# Patient Record
Sex: Male | Born: 1995 | Race: White | Hispanic: No | Marital: Single | Smoking: Former smoker
Health system: Southern US, Community
[De-identification: ages and names within clinical notes are randomized; demographics above are authoritative.]

## PROBLEM LIST (undated history)

## (undated) DIAGNOSIS — L0501 Pilonidal cyst with abscess: Secondary | ICD-10-CM

## (undated) DIAGNOSIS — T4145XA Adverse effect of unspecified anesthetic, initial encounter: Secondary | ICD-10-CM

## (undated) DIAGNOSIS — E538 Deficiency of other specified B group vitamins: Secondary | ICD-10-CM

## (undated) DIAGNOSIS — R51 Headache: Secondary | ICD-10-CM

## (undated) DIAGNOSIS — R519 Headache, unspecified: Secondary | ICD-10-CM

## (undated) DIAGNOSIS — T8859XA Other complications of anesthesia, initial encounter: Secondary | ICD-10-CM

## (undated) HISTORY — PX: TONSILLECTOMY: SUR1361

## (undated) HISTORY — DX: Deficiency of other specified B group vitamins: E53.8

## (undated) HISTORY — DX: Pilonidal cyst with abscess: L05.01

## (undated) HISTORY — PX: MIDDLE EAR SURGERY: SHX713

---
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
## 2017-04-11 DIAGNOSIS — L0501 Pilonidal cyst with abscess: Secondary | ICD-10-CM | POA: Insufficient documentation

## 2017-04-14 ENCOUNTER — Ambulatory Visit (INDEPENDENT_AMBULATORY_CARE_PROVIDER_SITE_OTHER): Payer: BC Managed Care – PPO | Admitting: General Surgery

## 2017-04-14 ENCOUNTER — Encounter: Payer: Self-pay | Admitting: General Surgery

## 2017-04-14 VITALS — BP 138/82 | HR 84 | Temp 98.1°F | Wt 202.0 lb

## 2017-04-14 DIAGNOSIS — L0501 Pilonidal cyst with abscess: Secondary | ICD-10-CM

## 2017-04-14 MED ORDER — CLINDAMYCIN HCL 300 MG PO CAPS: 300.0000 mg | ORAL_CAPSULE | Freq: Three times a day (TID) | ORAL | 0 refills | Status: DC

## 2017-04-14 NOTE — Progress Notes (Signed)
[name], male   DOB: [birth year], 21 y.o.   MRN: [id number]  CC: Recurrent pilonidal cyst  HPI Benjamin Green is a 21 y.o. male who presents to clinic in consultation by Benjamin Green for a recurrent pilonidal cyst with abscess.  Patient reports that the area has drained on and off again for the last 4 years.  He has not been seen by a surgeon in greater than 3 years.  Patient reports she has not required antibiotics for some time but the area does continue to drain.  He denies any recent fevers, chills, nausea, vomiting, chest pain, shortness of breath, diarrhea, constipation.  He is here now because he desires to have the area removed.  HPI  Past Medical History:  Diagnosis Date  . Pilonidal cyst with abscess     Past Surgical History:  Procedure Laterality Date  . TONSILLECTOMY      Family History  Problem Relation Age of Onset  . Healthy Mother   . Breast cancer Maternal Aunt     Social History Social History   Tobacco Use  . Smoking status: Former Smoker    Packs/day: 0.50    Years: 6.00    Pack years: 3.00    Last attempt to quit: 06/14/2016    Years since quitting: 0.8  . Smokeless tobacco: Never Used  Substance Use Topics  . Alcohol use: Yes    Comment: occasional  . Drug use: Yes    Types: Marijuana    No Known Allergies  Current Outpatient Medications  Medication Sig Dispense Refill  . CORDRAN 0.05 % CREA Apply 1 application topically 1 day or 1 dose.    Marland Kitchen. ONEXTON 1.2-3.75 % GEL Apply 1 application topically 1 day or 1 dose.    Marland Kitchen. RETIN-A MICRO PUMP 0.08 % GEL Apply 1 application topically 1 day or 1 dose.    . clindamycin (CLEOCIN) 300 MG capsule Take 1 capsule (300 mg total) by mouth 3 (three) times daily. 30 capsule 0   No current facility-administered medications for this visit.      Review of Systems A thigh point review of systems was asked and was negative except for the findings documented in the HPI  Physical Exam Blood  pressure 138/82, pulse 84, temperature 98.1 F (36.7 C), temperature source Oral, weight 91.6 kg (202 lb). CONSTITUTIONAL: No acute distress. EYES: Pupils are equal, round, and reactive to light, Sclera are non-icteric. EARS, NOSE, MOUTH AND THROAT: The oropharynx is clear. The oral mucosa is pink and moist. Hearing is intact to voice. LYMPH NODES:  Lymph nodes in the neck are normal. RESPIRATORY:  Lungs are clear. There is normal respiratory effort, with equal breath sounds bilaterally, and without pathologic use of accessory muscles. CARDIOVASCULAR: Heart is regular without murmurs, gallops, or rubs. GI: The abdomen is soft, nontender, and nondistended. There are no palpable masses. There is no hepatosplenomegaly. There are normal bowel sounds in all quadrants. GU: Rectal deferred.   MUSCULOSKELETAL: Normal muscle strength and tone. No cyanosis or edema.   SKIN: Obvious pilonidal cyst with skin changes of possible chronic infection at the cephalad aspect of the natal cleft.  Multiple puncta of pilonidal disease down the entirety of the cleft without obvious abscess changes to any other pit. NEUROLOGIC: Motor and sensation is grossly normal. Cranial nerves are grossly intact. PSYCH:  Oriented to person, place and time. Affect is normal.  Data Reviewed No images or labs to review I have personally reviewed  the patient's imaging, laboratory findings and medical records.    Assessment    Recurrent pilonidal cyst with abscess    Plan    21 year old male with a recurrent pilonidal cyst with abscess.  Discussed the diagnosis in detail with the patient as well as the surgical options.  Patient initially stated that he wanted the entirety of the pilonidal cysts and pets removed.  However given the large distance between the superior and inferior aspect of the pets I discussed the possibility of doing just an excision of the recurrent abscess.  Discussed that with the patient that the disease the  primary culprit is hair and that adequate hair removal and personal hygiene could prevent further abscesses.  He voiced understanding and agrees to proceed with the smaller excision at this point.  His work schedule only allows him operative time next Friday which I discussed with by my partner, BenjaminPiscoya who agrees to take on this case.  Plan to provide him with a prescription for clindamycin for now given the recurrent infectious nature of the wound.  Plan for excision of the pilonidal cyst with abscess next Friday, 21 December.     Time spent with the patient was 30 minutes, with more than 50% of the time spent in face-to-face education, counseling and care coordination.     Benjamin Frameharles Nykeria Mealing, MD FACS General Surgeon 04/14/2017, 10:29 AM

## 2017-04-14 NOTE — H&P (View-Only) (Signed)
[name], male   DOB: 06/12/1995, 21 y.o.   MRN: [id number]  CC: Recurrent pilonidal cyst  HPI Benjamin Green is a 21 y.o. male who presents to clinic in consultation by Dr. Olmedo for a recurrent pilonidal cyst with abscess.  Patient reports that the area has drained on and off again for the last 4 years.  He has not been seen by a surgeon in greater than 3 years.  Patient reports she has not required antibiotics for some time but the area does continue to drain.  He denies any recent fevers, chills, nausea, vomiting, chest pain, shortness of breath, diarrhea, constipation.  He is here now because he desires to have the area removed.  HPI  Past Medical History:  Diagnosis Date  . Pilonidal cyst with abscess     Past Surgical History:  Procedure Laterality Date  . TONSILLECTOMY      Family History  Problem Relation Age of Onset  . Healthy Mother   . Breast cancer Maternal Aunt     Social History Social History   Tobacco Use  . Smoking status: Former Smoker    Packs/day: 0.50    Years: 6.00    Pack years: 3.00    Last attempt to quit: 06/14/2016    Years since quitting: 0.8  . Smokeless tobacco: Never Used  Substance Use Topics  . Alcohol use: Yes    Comment: occasional  . Drug use: Yes    Types: Marijuana    No Known Allergies  Current Outpatient Medications  Medication Sig Dispense Refill  . CORDRAN 0.05 % CREA Apply 1 application topically 1 day or 1 dose.    . ONEXTON 1.2-3.75 % GEL Apply 1 application topically 1 day or 1 dose.    . RETIN-A MICRO PUMP 0.08 % GEL Apply 1 application topically 1 day or 1 dose.    . clindamycin (CLEOCIN) 300 MG capsule Take 1 capsule (300 mg total) by mouth 3 (three) times daily. 30 capsule 0   No current facility-administered medications for this visit.      Review of Systems A thigh point review of systems was asked and was negative except for the findings documented in the HPI  Physical Exam Blood  pressure 138/82, pulse 84, temperature 98.1 F (36.7 C), temperature source Oral, weight 91.6 kg (202 lb). CONSTITUTIONAL: No acute distress. EYES: Pupils are equal, round, and reactive to light, Sclera are non-icteric. EARS, NOSE, MOUTH AND THROAT: The oropharynx is clear. The oral mucosa is pink and moist. Hearing is intact to voice. LYMPH NODES:  Lymph nodes in the neck are normal. RESPIRATORY:  Lungs are clear. There is normal respiratory effort, with equal breath sounds bilaterally, and without pathologic use of accessory muscles. CARDIOVASCULAR: Heart is regular without murmurs, gallops, or rubs. GI: The abdomen is soft, nontender, and nondistended. There are no palpable masses. There is no hepatosplenomegaly. There are normal bowel sounds in all quadrants. GU: Rectal deferred.   MUSCULOSKELETAL: Normal muscle strength and tone. No cyanosis or edema.   SKIN: Obvious pilonidal cyst with skin changes of possible chronic infection at the cephalad aspect of the natal cleft.  Multiple puncta of pilonidal disease down the entirety of the cleft without obvious abscess changes to any other pit. NEUROLOGIC: Motor and sensation is grossly normal. Cranial nerves are grossly intact. PSYCH:  Oriented to person, place and time. Affect is normal.  Data Reviewed No images or labs to review I have personally reviewed   the patient's imaging, laboratory findings and medical records.    Assessment    Recurrent pilonidal cyst with abscess    Plan    21 year old male with a recurrent pilonidal cyst with abscess.  Discussed the diagnosis in detail with the patient as well as the surgical options.  Patient initially stated that he wanted the entirety of the pilonidal cysts and pets removed.  However given the large distance between the superior and inferior aspect of the pets I discussed the possibility of doing just an excision of the recurrent abscess.  Discussed that with the patient that the disease the  primary culprit is hair and that adequate hair removal and personal hygiene could prevent further abscesses.  He voiced understanding and agrees to proceed with the smaller excision at this point.  His work schedule only allows him operative time next Friday which I discussed with by my partner, Dr.Piscoya who agrees to take on this case.  Plan to provide him with a prescription for clindamycin for now given the recurrent infectious nature of the wound.  Plan for excision of the pilonidal cyst with abscess next Friday, 21 December.     Time spent with the patient was 30 minutes, with more than 50% of the time spent in face-to-face education, counseling and care coordination.     Benjamin Frameharles Riot Barrick, MD FACS General Surgeon 04/14/2017, 10:29 AM

## 2017-04-14 NOTE — Patient Instructions (Signed)
Pilonidal Cyst A pilonidal cyst is a fluid-filled sac. It forms beneath the skin near your tailbone, at the top of the crease of your buttocks. A pilonidal cyst that is not large or infected may not cause symptoms or problems. If the cyst becomes irritated or infected, it may fill with pus. This causes pain and swelling (pilonidal abscess). An infected cyst may need to be treated with medicine, drained, or removed. What are the causes? The cause of a pilonidal cyst is not known. One cause may be a hair that grows into your skin (ingrown hair). What increases the risk? Pilonidal cysts are more common in boys and men. Risk factors include:  Having lots of hair near the crease of the buttocks.  Being overweight.  Having a pilonidal dimple.  Wearing tight clothing.  Not bathing or showering frequently.  Sitting for long periods of time.  What are the signs or symptoms? Signs and symptoms of a pilonidal cyst may include:  Redness.  Pain and tenderness.  Warmth.  Swelling.  Pus.  Fever.  How is this diagnosed? Your health care provider may diagnose a pilonidal cyst based on your symptoms and a physical exam. The health care provider may do a blood test to check for infection. If your cyst is draining pus, your health care provider may take a sample of the drainage to be tested at a laboratory. How is this treated? Surgery is the usual treatment for an infected pilonidal cyst. You may also have to take medicines before surgery. The type of surgery you have depends on the size and severity of the infected cyst. The different kinds of surgery include:  Incision and drainage. This is a procedure to open and drain the cyst.  Marsupialization. In this procedure, a large cyst or abscess may be opened and kept open by stitching the edges of the skin to the cyst walls.  Cyst removal. This procedure involves opening the skin and removing all or part of the cyst.  Follow these  instructions at home:  Follow all of your surgeon's instructions carefully if you had surgery.  Take medicines only as directed by your health care provider.  If you were prescribed an antibiotic medicine, finish it all even if you start to feel better.  Keep the area around your pilonidal cyst clean and dry.  Clean the area as directed by your health care provider. Pat the area dry with a clean towel. Do not rub it as this may cause bleeding.  Remove hair from the area around the cyst as directed by your health care provider.  Do not wear tight clothing or sit in one place for long periods of time.  There are many different ways to close and cover an incision, including stitches, skin glue, and adhesive strips. Follow your health care provider's instructions on: ? Incision care. ? Bandage (dressing) changes and removal. ? Incision closure removal. Contact a health care provider if:  You have drainage, redness, swelling, or pain at the site of the cyst.  You have a fever. This information is not intended to replace advice given to you by your health care provider. Make sure you discuss any questions you have with your health care provider. Document Released: 04/15/2000 Document Revised: 09/24/2015 Document Reviewed: 09/05/2013 Elsevier Interactive Patient Education  2018 [employer]  

## 2017-04-18 ENCOUNTER — Telehealth: Payer: Self-pay | Admitting: Surgery

## 2017-04-18 NOTE — Telephone Encounter (Signed)
Pt advised of pre op date/time and sx date. Sx: 04/21/17 with Dr Velvet BathePiscoya--excision of pilonidal cyst.  Pre op: 04/19/17 between 9-1:00pm--Phone interview.   Patient made aware to call [phone], between 1-3:00pm the day before surgery, to find out what time to arrive.    Patient has agreed to pay a deposit for physician charge of 300.00 prior to surgery.

## 2017-04-19 ENCOUNTER — Encounter
Admission: RE | Admit: 2017-04-19 | Discharge: 2017-04-19 | Disposition: A | Discharge status: Home / Self Care | Payer: BC Managed Care – PPO | Source: Ambulatory Visit | Attending: Surgery | Admitting: Surgery

## 2017-04-19 ENCOUNTER — Other Ambulatory Visit: Payer: Self-pay

## 2017-04-19 HISTORY — DX: Headache, unspecified: R51.9

## 2017-04-19 HISTORY — DX: Headache: R51

## 2017-04-19 HISTORY — DX: Other complications of anesthesia, initial encounter: T88.59XA

## 2017-04-19 HISTORY — DX: Adverse effect of unspecified anesthetic, initial encounter: T41.45XA

## 2017-04-19 NOTE — Patient Instructions (Signed)
  Your procedure is scheduled on: 04-21-17 Report to Same Day Surgery 2nd floor medical mall [facility](Medical Mall Entrance-take elevator on left to 2nd floor.  Check in with surgery information desk.) To find out your arrival time please call [phone] between 1PM - 3PM on 21-20-18  Remember: Instructions that are not followed completely may result in serious medical risk, up to and including death, or upon the discretion of your surgeon and anesthesiologist your surgery may need to be rescheduled.    _x___ 1. Do not eat food after midnight the night before your procedure. You may drink clear liquids up to 2 hours before you are scheduled to arrive at the hospital for your procedure.  Do not drink clear liquids within 2 hours of your scheduled arrival to the hospital.  Clear liquids include  --Water or Apple juice without pulp  --Clear carbohydrate beverage such as ClearFast or Gatorade  --Black Coffee or Clear Tea (No milk, no creamers, do not add anything to the coffee or Tea   No gum chewing or hard candies.     __x__ 2. No Alcohol for 24 hours before or after surgery.   __x__3. No Smoking/VAPING for 24 prior to surgery.   ____  4. Bring all medications with you on the day of surgery if instructed.    __x__ 5. Notify your doctor if there is any change in your medical condition     (cold, fever, infections).     Do not wear jewelry, make-up, hairpins, clips or nail polish.  Do not wear lotions, powders, or perfumes. You may wear deodorant.  Do not shave 48 hours prior to surgery. Men may shave face and neck.  Do not bring valuables to the hospital.    [facility] any belongings or valuables.               Contacts, dentures or bridgework may not be worn into surgery.  Leave your suitcase in the car. After surgery it may be brought to your room.  For patients admitted to the hospital, discharge time is determined by your treatment team.   Patients discharged  the day of surgery will not be allowed to drive home.  You will need someone to drive you home and stay with you the night of your procedure.    Please read over the following fact sheets that you were given:   [facility] Preparing [facility] and or MRSA Information   ____ Take anti-hypertensive listed below, cardiac, seizure, asthma,anti-reflux and psychiatric medicines. These include:  1. NONE  2.  3.  4.  5.  6.  ____Fleets enema or Magnesium Citrate as directed.   ____ Use CHG Soap or sage wipes as directed on instruction sheet   ____ Use inhalers on the day of surgery and bring to hospital day of surgery  ____ Stop Metformin and Janumet 2 days prior to surgery.    ____ Take 1/2 of usual insulin dose the night before surgery and none on the morning     surgery.   ____ Follow recommendations from Cardiologist, Pulmonologist or PCP regarding stopping Aspirin, Coumadin, Plavix ,Eliquis, Effient, or Pradaxa, and Pletal.  X____Stop Anti-inflammatories such as Advil, Aleve, IBUPROFEN, Motrin, Naproxen, Naprosyn, Goodies powders or aspirin products NOW-OK to take Tylenol    ____ Stop supplements until after surgery.     ____ Bring C-Pap to the hospital.

## 2017-04-20 ENCOUNTER — Encounter: Payer: Self-pay | Admitting: *Deleted

## 2017-04-20 MED ORDER — CEFAZOLIN SODIUM-DEXTROSE 2-4 GM/100ML-% IV SOLN
2.0000 g | INTRAVENOUS | Status: AC
  Administered 2017-04-21: 2 g via INTRAVENOUS

## 2017-04-21 ENCOUNTER — Encounter
Admission: RE | Disposition: A | Discharge status: Home / Self Care | Payer: Self-pay | Source: Ambulatory Visit | Attending: Surgery

## 2017-04-21 ENCOUNTER — Ambulatory Visit: Payer: BC Managed Care – PPO | Admitting: Certified Registered Nurse Anesthetist

## 2017-04-21 ENCOUNTER — Encounter: Payer: Self-pay | Admitting: *Deleted

## 2017-04-21 ENCOUNTER — Ambulatory Visit
Admission: RE | Admit: 2017-04-21 | Discharge: 2017-04-21 | Disposition: A | Discharge status: Home / Self Care | Payer: BC Managed Care – PPO | Source: Ambulatory Visit | Attending: Surgery | Admitting: Surgery

## 2017-04-21 ENCOUNTER — Other Ambulatory Visit: Payer: Self-pay

## 2017-04-21 DIAGNOSIS — Z87891 Personal history of nicotine dependence: Secondary | ICD-10-CM | POA: Diagnosis not present

## 2017-04-21 DIAGNOSIS — L0501 Pilonidal cyst with abscess: Secondary | ICD-10-CM | POA: Insufficient documentation

## 2017-04-21 HISTORY — PX: PILONIDAL CYST EXCISION: SHX744

## 2017-04-21 LAB — URINE DRUG SCREEN, QUALITATIVE (ARMC ONLY)
AMPHETAMINES, UR SCREEN: NOT DETECTED
Barbiturates, Ur Screen: NOT DETECTED
Benzodiazepine, Ur Scrn: NOT DETECTED
CANNABINOID 50 NG, UR ~~LOC~~: POSITIVE — AB
COCAINE METABOLITE, UR ~~LOC~~: NOT DETECTED
MDMA (ECSTASY) UR SCREEN: NOT DETECTED
METHADONE SCREEN, URINE: NOT DETECTED
Opiate, Ur Screen: NOT DETECTED
Phencyclidine (PCP) Ur S: NOT DETECTED
TRICYCLIC, UR SCREEN: NOT DETECTED

## 2017-04-21 SURGERY — EXCISION, PILONIDAL CYST, EXTENSIVE
Anesthesia: General | Site: Buttocks | Wound class: Dirty or Infected

## 2017-04-21 MED ORDER — MEPERIDINE HCL 50 MG/ML IJ SOLN: 6.2500 mg | INTRAMUSCULAR | Status: DC | PRN

## 2017-04-21 MED ORDER — PROMETHAZINE HCL 25 MG/ML IJ SOLN: 6.2500 mg | INTRAMUSCULAR | Status: DC | PRN

## 2017-04-21 MED ORDER — ONDANSETRON HCL 4 MG/2ML IJ SOLN
INTRAMUSCULAR | Status: AC
  Filled 2017-04-21: qty 2

## 2017-04-21 MED ORDER — ONDANSETRON HCL 4 MG/2ML IJ SOLN
INTRAMUSCULAR | Status: DC | PRN
  Administered 2017-04-21: 4 mg via INTRAVENOUS

## 2017-04-21 MED ORDER — OXYCODONE HCL 5 MG/5ML PO SOLN: 5.0000 mg | Freq: Once | ORAL | Status: AC | PRN

## 2017-04-21 MED ORDER — SUGAMMADEX SODIUM 200 MG/2ML IV SOLN
INTRAVENOUS | Status: DC | PRN
  Administered 2017-04-21: 200 mg via INTRAVENOUS

## 2017-04-21 MED ORDER — LIDOCAINE HCL (PF) 2 % IJ SOLN
INTRAMUSCULAR | Status: AC
  Filled 2017-04-21: qty 10

## 2017-04-21 MED ORDER — FENTANYL CITRATE (PF) 100 MCG/2ML IJ SOLN
INTRAMUSCULAR | Status: DC | PRN
  Administered 2017-04-21 (×2): 50 ug via INTRAVENOUS
  Administered 2017-04-21: 100 ug via INTRAVENOUS
  Administered 2017-04-21: 50 ug via INTRAVENOUS

## 2017-04-21 MED ORDER — IBUPROFEN 600 MG PO TABS: 600.0000 mg | ORAL_TABLET | Freq: Three times a day (TID) | ORAL | 0 refills | Status: AC | PRN

## 2017-04-21 MED ORDER — ROCURONIUM BROMIDE 50 MG/5ML IV SOLN
INTRAVENOUS | Status: AC
  Filled 2017-04-21: qty 1

## 2017-04-21 MED ORDER — CHLORHEXIDINE GLUCONATE CLOTH 2 % EX PADS: 6.0000 | MEDICATED_PAD | Freq: Once | CUTANEOUS | Status: DC

## 2017-04-21 MED ORDER — OXYCODONE HCL 5 MG PO TABS
ORAL_TABLET | ORAL | Status: AC
  Filled 2017-04-21: qty 1

## 2017-04-21 MED ORDER — SUGAMMADEX SODIUM 200 MG/2ML IV SOLN
INTRAVENOUS | Status: AC
  Filled 2017-04-21: qty 2

## 2017-04-21 MED ORDER — FAMOTIDINE 20 MG PO TABS: 20.0000 mg | ORAL_TABLET | Freq: Once | ORAL | Status: DC

## 2017-04-21 MED ORDER — DEXAMETHASONE SODIUM PHOSPHATE 10 MG/ML IJ SOLN
INTRAMUSCULAR | Status: DC | PRN
  Administered 2017-04-21: 10 mg via INTRAVENOUS

## 2017-04-21 MED ORDER — OXYCODONE HCL 5 MG PO TABS: 5.0000 mg | ORAL_TABLET | Freq: Four times a day (QID) | ORAL | 0 refills | Status: DC | PRN

## 2017-04-21 MED ORDER — MIDAZOLAM HCL 2 MG/2ML IJ SOLN
INTRAMUSCULAR | Status: AC
  Filled 2017-04-21: qty 2

## 2017-04-21 MED ORDER — KETOROLAC TROMETHAMINE 30 MG/ML IJ SOLN
INTRAMUSCULAR | Status: DC | PRN
  Administered 2017-04-21: 30 mg via INTRAVENOUS

## 2017-04-21 MED ORDER — LIDOCAINE HCL (CARDIAC) 20 MG/ML IV SOLN
INTRAVENOUS | Status: DC | PRN
  Administered 2017-04-21: 80 mg via INTRAVENOUS

## 2017-04-21 MED ORDER — MIDAZOLAM HCL 2 MG/2ML IJ SOLN
INTRAMUSCULAR | Status: DC | PRN
  Administered 2017-04-21: 2 mg via INTRAVENOUS

## 2017-04-21 MED ORDER — FENTANYL CITRATE (PF) 250 MCG/5ML IJ SOLN
INTRAMUSCULAR | Status: AC
  Filled 2017-04-21: qty 5

## 2017-04-21 MED ORDER — OXYCODONE HCL 5 MG PO TABS
5.0000 mg | ORAL_TABLET | Freq: Once | ORAL | Status: AC | PRN
Start: 2017-04-21 — End: 2017-04-21
  Administered 2017-04-21: 5 mg via ORAL

## 2017-04-21 MED ORDER — BACITRACIN-NEOMYCIN-POLYMYXIN 400-5-5000 EX OINT
TOPICAL_OINTMENT | CUTANEOUS | Status: DC | PRN
  Administered 2017-04-21: 1 via TOPICAL

## 2017-04-21 MED ORDER — KETOROLAC TROMETHAMINE 30 MG/ML IJ SOLN
INTRAMUSCULAR | Status: AC
  Filled 2017-04-21: qty 1

## 2017-04-21 MED ORDER — PHENYLEPHRINE HCL 10 MG/ML IJ SOLN
INTRAMUSCULAR | Status: DC | PRN
  Administered 2017-04-21: 100 ug via INTRAVENOUS

## 2017-04-21 MED ORDER — BUPIVACAINE-EPINEPHRINE (PF) 0.5% -1:200000 IJ SOLN
INTRAMUSCULAR | Status: AC
  Filled 2017-04-21: qty 30

## 2017-04-21 MED ORDER — PROPOFOL 10 MG/ML IV BOLUS
INTRAVENOUS | Status: AC
  Filled 2017-04-21: qty 20

## 2017-04-21 MED ORDER — PROPOFOL 10 MG/ML IV BOLUS
INTRAVENOUS | Status: DC | PRN
  Administered 2017-04-21: 180 mg via INTRAVENOUS

## 2017-04-21 MED ORDER — FAMOTIDINE 20 MG PO TABS
ORAL_TABLET | ORAL | Status: AC
  Filled 2017-04-21: qty 1

## 2017-04-21 MED ORDER — DEXAMETHASONE SODIUM PHOSPHATE 10 MG/ML IJ SOLN
INTRAMUSCULAR | Status: AC
  Filled 2017-04-21: qty 1

## 2017-04-21 MED ORDER — FENTANYL CITRATE (PF) 100 MCG/2ML IJ SOLN: 25.0000 ug | INTRAMUSCULAR | Status: DC | PRN

## 2017-04-21 MED ORDER — ROCURONIUM BROMIDE 100 MG/10ML IV SOLN
INTRAVENOUS | Status: DC | PRN
  Administered 2017-04-21: 40 mg via INTRAVENOUS
  Administered 2017-04-21: 10 mg via INTRAVENOUS

## 2017-04-21 MED ORDER — LACTATED RINGERS IV SOLN
INTRAVENOUS | Status: DC
  Administered 2017-04-21: 10:00:00 via INTRAVENOUS

## 2017-04-21 MED ORDER — BUPIVACAINE-EPINEPHRINE 0.5% -1:200000 IJ SOLN
INTRAMUSCULAR | Status: DC | PRN
  Administered 2017-04-21: 20 mL

## 2017-04-21 MED ORDER — CEFAZOLIN SODIUM-DEXTROSE 2-4 GM/100ML-% IV SOLN
INTRAVENOUS | Status: AC
  Filled 2017-04-21: qty 100

## 2017-04-21 MED ORDER — BACITRACIN-NEOMYCIN-POLYMYXIN 400-5-5000 EX OINT
TOPICAL_OINTMENT | CUTANEOUS | Status: AC
  Filled 2017-04-21: qty 1

## 2017-04-21 SURGICAL SUPPLY — 33 items
BLADE SURG 15 STRL LF DISP TIS (BLADE) ×1 IMPLANT
BLADE SURG 15 STRL SS (BLADE) ×1
BRIEF STRETCH MATERNITY 2XLG (MISCELLANEOUS) ×2 IMPLANT
CANISTER SUCT 1200ML W/VALVE (MISCELLANEOUS) ×2 IMPLANT
DRAPE LAPAROTOMY 100X77 ABD (DRAPES) ×2 IMPLANT
DRSG TEGADERM 4X4.75 (GAUZE/BANDAGES/DRESSINGS) ×2 IMPLANT
DRSG TELFA 4X3 1S NADH ST (GAUZE/BANDAGES/DRESSINGS) ×2 IMPLANT
ELECT CAUTERY BLADE 6.4 (BLADE) ×2 IMPLANT
ELECT REM PT RETURN 9FT ADLT (ELECTROSURGICAL) ×2
ELECTRODE REM PT RTRN 9FT ADLT (ELECTROSURGICAL) ×1 IMPLANT
GAUZE FLUFF 18X24 1PLY STRL (GAUZE/BANDAGES/DRESSINGS) ×2 IMPLANT
GLOVE SURG SYN 7.0 (GLOVE) ×2 IMPLANT
GLOVE SURG SYN 7.5  E (GLOVE) ×1
GLOVE SURG SYN 7.5 E (GLOVE) ×1 IMPLANT
GOWN STRL REUS W/ TWL LRG LVL3 (GOWN DISPOSABLE) ×2 IMPLANT
GOWN STRL REUS W/TWL LRG LVL3 (GOWN DISPOSABLE) ×2
KIT RM TURNOVER STRD PROC AR (KITS) ×2 IMPLANT
LABEL OR SOLS (LABEL) ×2 IMPLANT
NEEDLE HYPO 22GX1.5 SAFETY (NEEDLE) ×2 IMPLANT
NS IRRIG 500ML POUR BTL (IV SOLUTION) ×2 IMPLANT
PACK BASIN MINOR ARMC (MISCELLANEOUS) ×2 IMPLANT
PAD ABD DERMACEA PRESS 5X9 (GAUZE/BANDAGES/DRESSINGS) ×2 IMPLANT
SOL PREP PVP 2OZ (MISCELLANEOUS) ×2
SOLUTION PREP PVP 2OZ (MISCELLANEOUS) ×1 IMPLANT
SUT ETHILON 2 0 FS 18 (SUTURE) ×4 IMPLANT
SUT MNCRL 4-0 (SUTURE) ×1
SUT MNCRL 4-0 27XMFL (SUTURE) ×1
SUT VIC AB 0 SH 27 (SUTURE) ×2 IMPLANT
SUT VIC AB 3-0 SH 27 (SUTURE) ×1
SUT VIC AB 3-0 SH 27X BRD (SUTURE) ×1 IMPLANT
SUTURE MNCRL 4-0 27XMF (SUTURE) ×1 IMPLANT
SYR 10ML LL (SYRINGE) ×2 IMPLANT
SYR BULB IRRIG 60ML STRL (SYRINGE) ×2 IMPLANT

## 2017-04-21 NOTE — Interval H&P Note (Signed)
History and Physical Interval Note:  04/21/2017 10:25 AM  Benjamin ShutterBenjamin D Magar  has presented today for surgery, with the diagnosis of PILONIDAL CYST WITH ABSCESS  The various methods of treatment have been discussed with the patient and family. After consideration of risks, benefits and other options for treatment, the patient has consented to  Procedure(s): CYST EXCISION PILONIDAL EXTENSIVE (N/A) as a surgical intervention .  The patient's history has been reviewed, patient examined, no change in status, stable for surgery.  I have reviewed the patient's chart and labs.  Questions were answered to the patient's satisfaction.     Cornesha Radziewicz

## 2017-04-21 NOTE — Transfer of Care (Signed)
Immediate Anesthesia Transfer of Care Note  Patient: Benjamin Green  Procedure(s) Performed: CYST EXCISION PILONIDAL EXTENSIVE (N/A Buttocks)  Patient Location: PACU  Anesthesia Type:General  Level of Consciousness: sedated  Airway & Oxygen Therapy: Patient Spontanous Breathing and Patient connected to face mask oxygen  Post-op Assessment: Report given to RN and Post -op Vital signs reviewed and stable  Post vital signs: Reviewed and stable  Last Vitals:  Vitals:   04/21/17 0925  BP: 132/69  Pulse: (!) 112  Resp: 18  Temp: 36.6 C  SpO2: 100%    Last Pain:  Vitals:   04/21/17 0925  TempSrc: Tympanic         Complications: No apparent anesthesia complications

## 2017-04-21 NOTE — Op Note (Signed)
  Procedure Date:  04/21/2017  Pre-operative Diagnosis:   Pilonidal cyst with abscess  Post-operative Diagnosis:  Pilonidal cyst with abscess  Procedure:  Pilonidal cyst excision  Surgeon:  Howie IllJose Luis Jalonda Antigua, MD  Anesthesia:  General endotracheal  Estimated Blood Loss:  5 ml  Specimens:  Pilonidal cyst  Complications:  None  Indications for Procedure:  This is a 21 y.o. male with diagnosis of pilonidal cyst with abscess which had been treated with antibiotics and presents today for excision.  The risks of bleeding, abscess or infection, injury to surrounding structures, and need for further procedures were all discussed with the patient and was willing to proceed.  Description of Procedure: The patient was correctly identified in the preoperative area and brought into the operating room.  The patient was placed supine with VTE prophylaxis in place.  Appropriate time-outs were performed.  Anesthesia was induced and the patient was intubated.  Appropriate antibiotics were infused.  The patient was then placed prone in the operating bed.  The patient's sacral area was prepped and draped in usual sterile fashion.  An elliptical 5 cm incision was made over the pilonidal cyst, and cautery was used to dissect the pilonidal cyst off.  The cyst was also consistent of a tract extending inferiorly, and this was also resected en block.  The cavity was then irrigated and hemostasis was achieved using cautery.  The cavity was then closed in layers using 0 Vicryl for deep and dermal layers, and then 2-0 Nylon to close the skin.  Antibiotic ointment was applied over the wound and it was dressed with Telfa gauze and Tegaderm.  The patient was then placed back on supine position, extubated, and brought to the recovery room for further management.  The patient tolerated the procedure well and all counts were correct at the end of the case.   Howie IllJose Luis Perel Hauschild, MD

## 2017-04-21 NOTE — Discharge Instructions (Signed)

## 2017-04-21 NOTE — Anesthesia Preprocedure Evaluation (Signed)
Anesthesia Evaluation  Patient identified by MRN, date of birth, ID band Patient awake    Reviewed: Allergy & Precautions, NPO status , Patient's Chart, lab work & pertinent test results  History of Anesthesia Complications Negative for: history of anesthetic complications  Airway Mallampati: II  TM Distance: >3 FB Neck ROM: Full    Dental no notable dental hx.    Pulmonary neg sleep apnea, neg COPD, former smoker,    breath sounds clear to auscultation- rhonchi (-) wheezing      Cardiovascular Exercise Tolerance: Good (-) hypertension(-) CAD, (-) Past MI and (-) Cardiac Stents  Rhythm:Regular Rate:Normal - Systolic murmurs and - Diastolic murmurs    Neuro/Psych  Headaches, negative psych ROS   GI/Hepatic negative GI ROS, Neg liver ROS,   Endo/Other  negative endocrine ROSneg diabetes  Renal/GU negative Renal ROS     Musculoskeletal negative musculoskeletal ROS (+)   Abdominal (+) + obese,   Peds  Hematology negative hematology ROS (+)   Anesthesia Other Findings   Reproductive/Obstetrics                             Anesthesia Physical Anesthesia Plan  ASA: II  Anesthesia Plan: General   Post-op Pain Management:    Induction: Intravenous  PONV Risk Score and Plan: 1 and Ondansetron and Dexamethasone  Airway Management Planned: Oral ETT  Additional Equipment:   Intra-op Plan:   Post-operative Plan: Extubation in OR  Informed Consent: I have reviewed the patients History and Physical, chart, labs and discussed the procedure including the risks, benefits and alternatives for the proposed anesthesia with the patient or authorized representative who has indicated his/her understanding and acceptance.   Dental advisory given  Plan Discussed with: CRNA and Anesthesiologist  Anesthesia Plan Comments:         Anesthesia Quick Evaluation

## 2017-04-21 NOTE — Anesthesia Post-op Follow-up Note (Signed)
Anesthesia QCDR form completed.        

## 2017-04-21 NOTE — Anesthesia Procedure Notes (Signed)
Procedure Name: Intubation Date/Time: 04/21/2017 11:25 AM Performed by: Eben Burow, CRNA Pre-anesthesia Checklist: Patient identified, Emergency Drugs available, Suction available, Patient being monitored and Timeout performed Patient Re-evaluated:Patient Re-evaluated prior to induction Oxygen Delivery Method: Circle system utilized Preoxygenation: Pre-oxygenation with 100% oxygen Induction Type: IV induction Ventilation: Mask ventilation without difficulty Laryngoscope Size: Miller and 2 Grade View: Grade I Tube type: Oral Tube size: 7.0 mm Number of attempts: 1 Airway Equipment and Method: Stylet and LTA kit utilized Placement Confirmation: ETT inserted through vocal cords under direct vision,  positive ETCO2 and breath sounds checked- equal and bilateral Secured at: 23 cm Tube secured with: Tape Dental Injury: Teeth and Oropharynx as per pre-operative assessment

## 2017-04-21 NOTE — Anesthesia Postprocedure Evaluation (Signed)
Anesthesia Post Note  Patient: Benjamin Green  Procedure(s) Performed: CYST EXCISION PILONIDAL EXTENSIVE (N/A Buttocks)  Patient location during evaluation: PACU Anesthesia Type: General Level of consciousness: awake and alert and oriented Pain management: pain level controlled Vital Signs Assessment: post-procedure vital signs reviewed and stable Respiratory status: spontaneous breathing, nonlabored ventilation and respiratory function stable Cardiovascular status: blood pressure returned to baseline and stable Postop Assessment: no signs of nausea or vomiting Anesthetic complications: no     Last Vitals:  Vitals:   04/21/17 1322 04/21/17 1350  BP: 130/62 130/78  Pulse: (!) 101 (!) 105  Resp: 16 16  Temp: 36.8 C   SpO2: 97% 98%    Last Pain:  Vitals:   04/21/17 1350  TempSrc:   PainSc: 3                  Xxavier Noon

## 2017-04-24 LAB — SURGICAL PATHOLOGY

## 2017-04-27 ENCOUNTER — Telehealth: Payer: Self-pay

## 2017-04-27 NOTE — Telephone Encounter (Signed)
Patient added to 04/28/17 Dr.Piscoy's schedule @ 8:30 am. Patient confirmed.

## 2017-04-28 ENCOUNTER — Ambulatory Visit (INDEPENDENT_AMBULATORY_CARE_PROVIDER_SITE_OTHER): Payer: BC Managed Care – PPO | Admitting: Surgery

## 2017-04-28 ENCOUNTER — Encounter: Payer: Self-pay | Admitting: Surgery

## 2017-04-28 VITALS — BP 130/84 | HR 87 | Temp 98.1°F | Ht 67.0 in | Wt 209.2 lb

## 2017-04-28 DIAGNOSIS — Z09 Encounter for follow-up examination after completed treatment for conditions other than malignant neoplasm: Secondary | ICD-10-CM

## 2017-04-28 NOTE — Progress Notes (Signed)
04/28/2017  HPI: Patient is s/p pilonidal cyst excision on 12/21 for prior pilonidal abscess.  He is doing well and reports only some soreness over the wound.  Denies any worsening pain, erythema, or purulent drainage.  Has finished his antibiotic course given from his recent abscess.  Vital signs: BP 130/84   Pulse 87   Temp 98.1 F (36.7 C) (Oral)   Ht 5\' 7"  (1.702 m)   Wt 94.9 kg (209 lb 3.2 oz)   BMI 32.77 kg/m    Physical Exam: Constitutional: No acute distress Skin:  Presacral wound healing well without any drainage, erythema, or induration.  7 Nylon sutures in place.  Superior corner of wound with mild skin sloughing.  Assessment/Plan: 21 yo male s/p pilonidal cyst excision  --removing 4 sutures today, leaving 3 behind to continue helping with wound healing and tension. --no further antibiotics needed.  Wound healing well without evidence of infection. --will come back next week for removal of remaining sutures.  Continue dry gauze dressing to keep area clean and dry.   Howie IllJose Luis Sharley Keeler, MD [facility]

## 2017-04-28 NOTE — Patient Instructions (Addendum)
Please keep a dressing over the wound to absorb any drainage that you may have. Please see your follow up appointment listed below.

## 2017-05-04 ENCOUNTER — Encounter: Payer: Self-pay | Admitting: Surgery

## 2017-05-04 ENCOUNTER — Ambulatory Visit (INDEPENDENT_AMBULATORY_CARE_PROVIDER_SITE_OTHER): Payer: BC Managed Care – PPO | Admitting: Surgery

## 2017-05-04 VITALS — BP 148/85 | HR 98 | Temp 98.1°F | Ht 67.0 in | Wt 204.6 lb

## 2017-05-04 DIAGNOSIS — L0501 Pilonidal cyst with abscess: Secondary | ICD-10-CM

## 2017-05-04 NOTE — Progress Notes (Signed)
Patient returns today he has no complaints he has had a pilonidal cyst excised by Dr. Aleen CampiPiscoya He has minimal drainage serous only.  Physical exam demonstrates no erythema minimal serous drainage from most cephalad extent of the wound.  3 sutures are still present and they are removed.  Patient doing very well continue dressing as needed and will follow up with Dr. Aleen CampiPiscoya in 2-3 weeks

## 2017-05-04 NOTE — Patient Instructions (Addendum)
Today we have removed your sutures. This area may continue to drain. Keep it covered, clean and dry at all times.   We will see you back in office as listed below, However if you need to be seen sooner or have any questions please give our office a call.

## 2017-05-08 ENCOUNTER — Ambulatory Visit (INDEPENDENT_AMBULATORY_CARE_PROVIDER_SITE_OTHER): Payer: BC Managed Care – PPO | Admitting: Surgery

## 2017-05-08 ENCOUNTER — Encounter: Payer: Self-pay | Admitting: Surgery

## 2017-05-08 ENCOUNTER — Telehealth: Payer: Self-pay | Admitting: Surgery

## 2017-05-08 VITALS — BP 130/80 | HR 111 | Temp 98.3°F | Wt 203.0 lb

## 2017-05-08 DIAGNOSIS — Z09 Encounter for follow-up examination after completed treatment for conditions other than malignant neoplasm: Secondary | ICD-10-CM

## 2017-05-08 NOTE — Telephone Encounter (Signed)
Patient is calling about his wound, patient said the very top of the wound if white, and is asking if this is normal, no redness, has some pus said it was draining yellowish-green, some blood,no pain,no fever,chills or nausea. Please call patient and advise.

## 2017-05-08 NOTE — Progress Notes (Signed)
05/08/2017  HPI: Patient is status post pilonidal cyst excision on 04/21/17.  He presents today for further follow-up.  He was seen in the office on 12/28 as well as 1/3 at which time his last sutures were removed.  He only had a very small portion of the superior portion of the wound that was opened draining serosanguineous fluid.  Patient called today because he was having further drainage from the incision.  He presents today for further evaluation.  Denies any worsening pain, purulent drainage, fevers, or chills.  Vital signs: BP 130/80   Pulse (!) 111   Temp 98.3 F (36.8 C) (Oral)   Wt 92.1 kg (203 lb)   BMI 31.79 kg/m    Physical Exam: Constitutional: No acute distress Skin: Pilonidal cyst excision site without evidence of infection.  The superior portion of the wound has open to about 1 cm in width superficially, resulting in the serosanguineous drainage that the patient is having.  There is no evidence of infection.  This area was treated with silver nitrate with no complications.  Assessment/Plan: 22 year old male status post pilonidal cyst excision.  -Reassured patient that currently there is no evidence of worsening infection and at this point the silver nitrate should help heal the wound faster help with the scarring and decrease the amount of drainage.  He can continue doing dry gauze dressing once daily and keep the wound clean and dry. -Patient will follow-up next week for a wound check.   [name], MD [facility]

## 2017-05-08 NOTE — Patient Instructions (Signed)
Please continue to change your dressing as often as needed.   We will see you next week to make sure that you are healing well.

## 2017-05-08 NOTE — Telephone Encounter (Signed)
Spoke with patient and he states he is having some yellowish pinkish drainage from the pilonidal cyst, but yesterday he had a slight greenish yellowish drainage on the gauze.He has some white tissue at the top of the wound. He denies fever,chills or pain.  Today he has a little thicker drainage with some blood. Patient added to schedule today for Dr.Piscoya.

## 2017-05-11 ENCOUNTER — Ambulatory Visit: Payer: Self-pay | Admitting: Surgery

## 2017-05-16 ENCOUNTER — Encounter: Payer: Self-pay | Admitting: Surgery

## 2017-05-16 ENCOUNTER — Ambulatory Visit (INDEPENDENT_AMBULATORY_CARE_PROVIDER_SITE_OTHER): Payer: BC Managed Care – PPO | Admitting: Surgery

## 2017-05-16 VITALS — BP 153/91 | HR 96 | Temp 98.3°F | Wt 202.0 lb

## 2017-05-16 DIAGNOSIS — Z09 Encounter for follow-up examination after completed treatment for conditions other than malignant neoplasm: Secondary | ICD-10-CM

## 2017-05-16 MED ORDER — AMOXICILLIN-POT CLAVULANATE 875-125 MG PO TABS: 1.0000 | ORAL_TABLET | Freq: Two times a day (BID) | ORAL | 0 refills | Status: DC

## 2017-05-16 NOTE — Progress Notes (Signed)
S/p excision of pilonidal cyst 12/21 by Dr. Aleen CampiPiscoya Some drainage. No fevers or chills Taking Po, minimal pain  PE NAd Wound w some superficial ulceration on caudal portion, some fat exposed. No erythema or un drained collection, pinpoint area of hypergranulation tissue.  A/P Doing ok after pilonidal, recommend short course of A/Bs and f/u w Dr. Aleen CampiPiscoya in 2 weeks

## 2017-05-16 NOTE — Patient Instructions (Signed)
Please give us a call in case you have any questions or concerns.  Please apply a large bandage on the cyst and if it needs to be changed several times a day, then please do so.  Please start taking you antibiotics today.  We will see you back in 2 weeks to make sure that you are doing better.

## 2017-05-18 DIAGNOSIS — L259 Unspecified contact dermatitis, unspecified cause: Secondary | ICD-10-CM | POA: Insufficient documentation

## 2017-05-24 ENCOUNTER — Encounter: Payer: BC Managed Care – PPO | Admitting: Surgery

## 2017-06-01 ENCOUNTER — Encounter: Payer: Self-pay | Admitting: Surgery

## 2017-06-01 ENCOUNTER — Ambulatory Visit (INDEPENDENT_AMBULATORY_CARE_PROVIDER_SITE_OTHER): Payer: BC Managed Care – PPO | Admitting: Surgery

## 2017-06-01 VITALS — BP 129/86 | HR 103 | Temp 97.3°F | Wt 190.0 lb

## 2017-06-01 DIAGNOSIS — L0501 Pilonidal cyst with abscess: Secondary | ICD-10-CM

## 2017-06-01 NOTE — Patient Instructions (Signed)
Please give us a call in case you have any concerns or questions.

## 2017-06-01 NOTE — Progress Notes (Signed)
  06/01/2017  History of Present Illness: Benjamin Green is a 22 y.o. male status post pilonidal cyst excision on 04/21/17.  His wound had healed well but then he noticed a few days ago that he started having some drainage from the inferior portion of the wound.  Denies any worsening pain.  He presents today for further evaluation of this drainage.  Denies any fevers or chills.  Past Medical History: Past Medical History:  Diagnosis Date  . Complication of anesthesia   . Headache    FREQUENT HA'S  . Pilonidal cyst with abscess      Past Surgical History: Past Surgical History:  Procedure Laterality Date  . PILONIDAL CYST EXCISION N/A 04/21/2017   Procedure: CYST EXCISION PILONIDAL EXTENSIVE;  Surgeon: Henrene DodgePiscoya, Kayah Hecker, MD;  Location: [facility] ORS;  Service: General;  Laterality: N/A;  . TONSILLECTOMY      Home Medications: Prior to Admission medications   Medication Sig Start Date End Date Taking? Authorizing Provider  CORDRAN 0.05 % CREA Apply 1 application topically daily as needed (dermatitis).  03/27/17  Yes [provider]  ibuprofen (ADVIL,MOTRIN) 600 MG tablet Take 1 tablet (600 mg total) by mouth every 8 (eight) hours as needed for headache, mild pain or moderate pain. 04/21/17  Yes Rashia Mckesson, MD  ONEXTON 1.2-3.75 % GEL Apply 1 application topically daily.  03/24/17  Yes [provider]  RETIN-A MICRO PUMP 0.08 % GEL Apply 1 application topically at bedtime.  03/27/17  Yes [provider]    Allergies: No Known Allergies   Review of Systems: Review of Systems  Constitutional: Negative for chills and fever.  Respiratory: Negative for shortness of breath.   Cardiovascular: Negative for chest pain.  Gastrointestinal: Negative for abdominal pain, nausea and vomiting.  Skin:       Drainage from inferior portion of wound    Physical Exam BP 129/86   Pulse (!) 103   Temp (!) 97.3 F (36.3 C) (Oral)   Wt 86.2 kg (190 lb)   BMI 29.76 kg/m   CONSTITUTIONAL: No acute distress RESPIRATORY:  Lungs are clear, and breath sounds are equal bilaterally. Normal respiratory effort without pathologic use of accessory muscles. CARDIOVASCULAR: Heart is regular without murmurs, gallops, or rubs. GI: The abdomen is soft, nondistended, nontender.  SKIN:  Pilonidal cyst excision wound has a 3 mm opening at the inferior portion, with some subcutaneous fat exposed.  The superior portion of the wound has scabbed over and there is no drainage from there.  There is no erythema or induration or purulent drainage.  Silver nitrate applied to lower portion wound. NEUROLOGIC:  Motor and sensation is grossly normal.  Cranial nerves are grossly intact. PSYCH:  Alert and oriented to person, place and time. Affect is normal.   Assessment and Plan: This is a 22 y.o. male who presents with drainage from the lower portion of his pilonidal cyst excision wound.  -Apply silver nitrate over the lower portion of the wound to allow it to scar better.  Apply dry gauze dressing with no complications. -Discussed with the patient that otherwise there is no evidence of infection we do not need to do any other procedures.  This portion of the wound should heal well just like the upper portion did after his last visit.  No antibiotics are needed either. -Patient will follow-up with us on an as-needed basis.   Howie IllJose Luis Kirstie Larsen, MD [facility]

## 2017-06-20 ENCOUNTER — Other Ambulatory Visit: Payer: Self-pay

## 2017-06-22 ENCOUNTER — Ambulatory Visit (INDEPENDENT_AMBULATORY_CARE_PROVIDER_SITE_OTHER): Payer: BC Managed Care – PPO | Admitting: General Surgery

## 2017-06-22 ENCOUNTER — Encounter: Payer: Self-pay | Admitting: General Surgery

## 2017-06-22 VITALS — BP 138/79 | HR 84 | Temp 97.8°F | Wt 185.0 lb

## 2017-06-22 DIAGNOSIS — Z4889 Encounter for other specified surgical aftercare: Secondary | ICD-10-CM

## 2017-06-22 NOTE — Progress Notes (Signed)
Outpatient Surgical Follow Up  06/22/2017  Benjamin ShutterBenjamin D Moulder is an 22 y.o. male.   Chief Complaint  Patient presents with  . Follow-up    PILONIDAL CYST EXCISION Wound opened up possible apply slive nitrate    HPI: 22 year old male returns to clinic for follow-up for his pilonidal cyst excision.  The area has previously opened up and is still draining.  He states the drainage has decreased but has not stopped.  He is otherwise without complaints.  He denies any fevers, chills, nausea, vomiting, chest pain, shortness of breath.  Past Medical History:  Diagnosis Date  . Complication of anesthesia   . Headache    FREQUENT HA'S  . Pilonidal cyst with abscess     Past Surgical History:  Procedure Laterality Date  . PILONIDAL CYST EXCISION N/A 04/21/2017   Procedure: CYST EXCISION PILONIDAL EXTENSIVE;  Surgeon: Henrene DodgePiscoya, Jose, MD;  Location: [facility] ORS;  Service: General;  Laterality: N/A;  . TONSILLECTOMY      Family History  Problem Relation Age of Onset  . Healthy Mother   . Breast cancer Maternal Aunt     Social History:  reports that he quit smoking about a year ago. His smoking use included cigarettes. He has a 3.00 pack-year smoking history. he has never used smokeless tobacco. He reports that he drinks alcohol. He reports that he uses drugs. Drug: Marijuana.  Allergies: No Known Allergies  Medications reviewed.    ROS A multipoint review of systems was completed, all pertinent positives and negatives are documented in the HPI and the remainder are negative   BP 138/79   Pulse 84   Temp 97.8 F (36.6 C) (Oral)   Wt 83.9 kg (185 lb)   BMI 28.98 kg/m   Physical Exam  General: No acute distress Skin: Pilonidal incision site examined.  1 cm area of skin opening with healthy-appearing granulation tissue.  The remainder of the incision site is well approximated and healing well.   No results found for this or any previous visit (from the past 48 hour(s)). No  results found.  Assessment/Plan:  1. Aftercare following surgery 22 year old male status post pilonidal cyst excision.  Doing well.  Silver nitrate applied to the open area of the wound.  Instructed him on daily wound care.  He will follow-up in clinic in 1 week for an additional wound check.     Ricarda Frameharles Roald Lukacs, MD [facility] General Sur[facility]  06/22/2017,11:57 AM

## 2017-06-22 NOTE — Patient Instructions (Signed)
We will see you back in 1 week to make sure that you are healing.

## 2017-06-29 ENCOUNTER — Other Ambulatory Visit
Admission: RE | Admit: 2017-06-29 | Discharge: 2017-06-29 | Disposition: A | Discharge status: Home / Self Care | Payer: BC Managed Care – PPO | Source: Ambulatory Visit | Attending: Surgery | Admitting: Surgery

## 2017-06-29 ENCOUNTER — Encounter: Payer: Self-pay | Admitting: Surgery

## 2017-06-29 ENCOUNTER — Other Ambulatory Visit: Payer: Self-pay

## 2017-06-29 ENCOUNTER — Ambulatory Visit (INDEPENDENT_AMBULATORY_CARE_PROVIDER_SITE_OTHER): Payer: BC Managed Care – PPO | Admitting: Surgery

## 2017-06-29 VITALS — BP 148/82 | HR 76 | Temp 98.1°F | Ht 67.0 in | Wt 179.0 lb

## 2017-06-29 DIAGNOSIS — R17 Unspecified jaundice: Secondary | ICD-10-CM

## 2017-06-29 DIAGNOSIS — L0501 Pilonidal cyst with abscess: Secondary | ICD-10-CM

## 2017-06-29 LAB — COMPREHENSIVE METABOLIC PANEL
ALBUMIN: 5.4 g/dL — AB (ref 3.5–5.0)
ALT: 21 U/L (ref 17–63)
AST: 25 U/L (ref 15–41)
Alkaline Phosphatase: 70 U/L (ref 38–126)
Anion gap: 13 (ref 5–15)
BUN: 13 mg/dL (ref 6–20)
CHLORIDE: 104 mmol/L (ref 101–111)
CO2: 21 mmol/L — AB (ref 22–32)
Calcium: 10.1 mg/dL (ref 8.9–10.3)
Creatinine, Ser: 0.85 mg/dL (ref 0.61–1.24)
GFR calc Af Amer: >60 mL/min (ref >60)
GFR calc non Af Amer: >60 mL/min (ref >60)
GLUCOSE: 88 mg/dL (ref 65–99)
POTASSIUM: 4 mmol/L (ref 3.5–5.1)
SODIUM: 138 mmol/L (ref 135–145)
Total Bilirubin: 7.3 mg/dL — ABNORMAL HIGH (ref 0.3–1.2)
Total Protein: 8.5 g/dL — ABNORMAL HIGH (ref 6.5–8.1)

## 2017-06-29 NOTE — Addendum Note (Signed)
Addended by: Deloria LairAYLOR, Iridian Reader on: 06/29/2017 10:36 AM   Modules accepted: Orders

## 2017-06-29 NOTE — Progress Notes (Signed)
Surgical Clinic Progress/Follow-up Note   HPI:  22 y.o. Male presents to clinic for follow-up evaluation of post-surgical ~6 mm sacrococcygeal wound just over 2 months s/p excision of his previously infected and symptomatic pilonidal cyst. Wound has been treated at variable intervals with silver nitrate to promote granulation tissue, most recently by Dr. Tonita CongWoodham 1 week ago and by Dr. Aleen CampiPiscoya 1 month ago. Patient reports just a small amount of clear pink drainage on adhesive bandages he's been applying, denies pain, purulent drainage, or fever/chills.  Unrelated, patient describes that his eyes first became "bright yellow" 3 weeks ago and again similarly yesterday. He says he's been told he was jaundiced as a newborn and has "always" had some yellow color to the white of his eyes. He adds that he has purposefully lost ~30 lbs recently by dietary changes and exercise and denies any abdominal pain or family history of pancreatic or biliary cancers. His appetite remains good, and he denies having ever had any bloodwork/labs checked, abnormal LFT's or otherwise.  Review of Systems:  Constitutional: denies any other weight loss, fever, chills, or sweats  Eyes: denies any other vision changes, history of eye injury  ENT: denies sore throat, hearing problems  Respiratory: denies shortness of breath, wheezing  Cardiovascular: denies chest pain, palpitations  Gastrointestinal: abdominal pain, N/V, and bowel function as per HPI Musculoskeletal: denies any other joint pains or cramps  Skin: Denies any other rashes or skin discolorations except as per HPI Neurological: denies any other headache, dizziness, weakness  Psychiatric: denies any other depression, anxiety  All other review of systems: otherwise negative   Vital Signs:  BP (!) 148/82   Pulse 76   Temp 98.1 F (36.7 C) (Oral)   Ht 5\' 7"  (1.702 m)   Wt 179 lb (81.2 kg)   BMI 28.04 kg/m    Physical Exam:  Constitutional:  -- Normal body  habitus  -- Awake, alert, and oriented x3  Eyes:  -- Pupils equally round and reactive to light  -- No scleral icterus  Ear, nose, throat:  -- No jugular venous distension  -- No nasal drainage, bleeding Pulmonary:  -- No crackles -- Equal breath sounds bilaterally -- Breathing non-labored at rest Cardiovascular:  -- S1, S2 present  -- No pericardial rubs  Gastrointestinal:  -- Soft, nontender, non-distended, no guarding/rebound  -- No abdominal masses appreciated, pulsatile or otherwise  Musculoskeletal / Integumentary:  -- Wounds or skin discoloration: ~6 mm open sacrococcygeal wound with pink granulation tissue at the base of otherwise well-healing post-surgical vertical linear scar   -- Extremities: B/L UE and LE FROM, hands and feet warm, no edema  Neurologic:  -- Motor function: intact and symmetric  -- Sensation: intact and symmetric   Laboratory studies:  No flowsheet data found. No flowsheet data found.  Imaging: No pertinent imaging available to review   Assessment:  22 y.o. yo Male with a problem list including...  Patient Active Problem List   Diagnosis Date Noted  . Contact dermatitis 05/18/2017  . Pilonidal cyst with abscess 04/11/2017    presents to clinic for follow-up evaluation of and reapplication of silver nitrate to post-surgical ~6 mm sacrococcygeal wound, doing overall well just over 2 months s/p excision of his previously infected and symptomatic pilonidal cyst with incidental mention/finding today of painless jaundice attributable to differential including congenital abnormality of bilirubin production/conjugation/clearance, acute hepatitis, or malignancy.  Plan:   - silver nitrate applied, wound care instructions reviewed  - CMP (including LFT's)  ordered, will request direct bilirubin  - will check abdominal ultrasound, including biliary tree and pancreas  - further workup of anticipated hyperbilirubinemia as per primary care physician barring any  surgically relevant findings of above preliminary workup  - return to clinic in 2 weeks, instructed to call office if any questions or concerns  All of the above recommendations were discussed with the patient, and all of patient's questions were answered to his expressed satisfaction.  -- Scherrie Gerlach Earlene Plater, MD, RPVI [place]: [facility] General Surgery - Partnering for exceptional care. Office: [phone]

## 2017-06-29 NOTE — Patient Instructions (Addendum)
We have ordered some labs to be drawn today. Please proceed to the Medical Mall to have these tests completed prior to leaving today. You will check in at the registration desk in the medical mall. Please see walking directions below if needed.  We will call you with the results and next step in plan of care as soon as results are received.   Directions to Medical Mall: When leaving our office, go right. Go all of the way down to the very end of the hallway. You will have a purple wall in front of you. You will now have a tunnel to the hospital on your left hand side. Go through this tunnel and the elevators will be on your left. Go down to the 1st floor and take a slight left. The very first desk on the right hand side is the registration desk.  We have scheduled you for an Ultrasound of your Abdomen. This has been scheduled for Tuesday, March 5th at 9:30AM at [facility] on [address]. You will need to arrive at 9:15AM.  [facility] [address], [place], [place] [address]  Bring a list of medications with you to your appointment.  Please do not have anything to eat or drink after midnight prior to your Ultrasound.   If you need to reschedule your Ultrasound, you may do so by calling [phone].   Abdominal Ultrasound An ultrasound is a test that uses sound waves to take pictures of the inside of the body. An abdominal ultrasound takes pictures of the inside of the abdomen, and a pelvic ultrasound takes pictures of the inside of the pelvis. An abdominal or pelvic ultrasound may be done:  To provide information about a baby developing in the womb, such as the baby's position and heartbeat.  To check the shape or size of an organ.  To check for problems such as: ? Cysts. ? Masses. ? Inflammation. ? Kidney stones. ? Gallstones.  Tell a health care provider about:  Any allergies you have.  All medicines you are taking, including vitamins,  herbs, eye drops, creams, and over-the-counter medicines.  Previous surgeries you have had.  Any medical conditions you have.  Whether you are pregnant or may be pregnant. What are the risks? There are no known risks or complications from having this test. What happens before the procedure?  Follow instructions from your health care provider about eating or drinking restrictions.  Wear clothing that is easily washable in case the gel used for the test gets on your clothes. What happens during the procedure?  A gel will be applied to your skin. It may feel cool.  A wand-like device called a transducer will be placed on the area to be examined.  The transducer will take pictures. These will be displayed on one or more monitors that look like small TV screens. What happens after the procedure?  It is your responsibility to get your test results. Ask your health care provider or the department performing the test when your results will be ready.  Keep follow-up visits as told by your health care provider. This is important. This information is not intended to replace advice given to you by your health care provider. Make sure you discuss any questions you have with your health care provider. Document Released: 04/15/2000 Document Revised: 12/30/2015 Document Reviewed: 01/09/2015 Elsevier Interactive Patient Education  2018 [employer].       We will see you back as listed  below:  Please give our office a call if you have any questions or concerns.

## 2017-07-04 ENCOUNTER — Ambulatory Visit: Payer: BC Managed Care – PPO

## 2017-07-04 ENCOUNTER — Telehealth: Payer: Self-pay | Admitting: Surgery

## 2017-07-04 ENCOUNTER — Telehealth: Payer: Self-pay

## 2017-07-04 NOTE — Telephone Encounter (Signed)
Patient called asking if his results came in on his blood work. Please call patient and advise.

## 2017-07-04 NOTE — Telephone Encounter (Signed)
Call made to cnetral scheduling at this time. Rescheduled patient's US to March 8 with arrival time of 8:45 for a 9:00AM appointment.  Returned call to patient at this time and advised him that we have received his lab results back and that

## 2017-07-06 ENCOUNTER — Telehealth: Payer: Self-pay

## 2017-07-06 ENCOUNTER — Other Ambulatory Visit: Payer: Self-pay

## 2017-07-06 ENCOUNTER — Other Ambulatory Visit
Admission: RE | Admit: 2017-07-06 | Discharge: 2017-07-06 | Disposition: A | Discharge status: Home / Self Care | Payer: BC Managed Care – PPO | Source: Ambulatory Visit | Attending: Surgery | Admitting: Surgery

## 2017-07-06 ENCOUNTER — Ambulatory Visit
Admission: RE | Admit: 2017-07-06 | Discharge: 2017-07-06 | Disposition: A | Discharge status: Home / Self Care | Payer: BC Managed Care – PPO | Source: Ambulatory Visit | Attending: Surgery | Admitting: Surgery

## 2017-07-06 DIAGNOSIS — Q442 Atresia of bile ducts: Secondary | ICD-10-CM

## 2017-07-06 DIAGNOSIS — R945 Abnormal results of liver function studies: Secondary | ICD-10-CM | POA: Diagnosis present

## 2017-07-06 DIAGNOSIS — R7989 Other specified abnormal findings of blood chemistry: Secondary | ICD-10-CM

## 2017-07-06 LAB — HEPATIC FUNCTION PANEL
ALT: 16 U/L — ABNORMAL LOW (ref 17–63)
AST: 19 U/L (ref 15–41)
Albumin: 5 g/dL (ref 3.5–5.0)
Alkaline Phosphatase: 77 U/L (ref 38–126)
BILIRUBIN INDIRECT: 3.4 mg/dL — AB (ref 0.3–0.9)
Bilirubin, Direct: 0.2 mg/dL (ref 0.1–0.5)
Total Bilirubin: 3.6 mg/dL — ABNORMAL HIGH (ref 0.3–1.2)
Total Protein: 8.1 g/dL (ref 6.5–8.1)

## 2017-07-06 LAB — CBC WITH DIFFERENTIAL/PLATELET
BASOS ABS: 0 10*3/uL (ref 0–0.1)
Basophils Relative: 1 %
Eosinophils Absolute: 0 10*3/uL (ref 0–0.7)
Eosinophils Relative: 1 %
HEMATOCRIT: 46.5 % (ref 40.0–52.0)
HEMOGLOBIN: 16.2 g/dL (ref 13.0–18.0)
LYMPHS PCT: 19 %
Lymphs Abs: 1.1 10*3/uL (ref 1.0–3.6)
MCH: 30.7 pg (ref 26.0–34.0)
MCHC: 34.9 g/dL (ref 32.0–36.0)
MCV: 87.7 fL (ref 80.0–100.0)
Monocytes Absolute: 0.8 10*3/uL (ref 0.2–1.0)
Monocytes Relative: 14 %
NEUTROS ABS: 3.9 10*3/uL (ref 1.4–6.5)
Neutrophils Relative %: 65 %
Platelets: 219 10*3/uL (ref 150–440)
RBC: 5.3 MIL/uL (ref 4.40–5.90)
RDW: 13.7 % (ref 11.5–14.5)
WBC: 5.9 10*3/uL (ref 3.8–10.6)

## 2017-07-06 LAB — PROTIME-INR
INR: 1.03
Prothrombin Time: 13.4 seconds (ref 11.4–15.2)

## 2017-07-06 NOTE — Telephone Encounter (Signed)
Call made to patient at this time. I advised him that Dr. Aleen CampiPiscoya would like for him to possibly having his US today due to his Bilirubin being elevated. I asked patient if he had eaten today and he stated that he has had a salad. I advised that we usually do the US with nothing to eat or drink 8 hours prior and that I would have to call the scheduling department back in order to see if he could have the US later today. He verbalized understanding.    Call made to Central Scheduling at this time and I spoke with Darl PikesSusan. I asked her how late are US scheduled in the day since I had a patient that was needing a STAT US that had already ate this morning. She verbalized that the patient would have to be NPO 8 hours prior and that she could schedule an US for the patient tomorrow morning. I verbalized to her that the patient already has anappointment for tomorrow morning and that I will keep his appointment.    Call made to patient at this time and I advised him that we will keep his schedule US for tomorrow morning since he has already ate. He did ask what level was his Bilirubin was at and I advised him 7.3. He was shocked and asked if there was anything else he can do at this time. I advised him that I will address his concerns with Dr. Aleen CampiPiscoya and advise him once I have an answer for him. I did ask if he was having any abdominal pain. He stated that he was not having any abdominal pain and that he was having some lower shoulder pain only when he moves a certain way. Patient denied fever/chills, nausea/vomiting, as well no constipation/diarrhea. I verbalized to him that if anything changes other than for him to have his US tomorrow morning that I would give him a call. He verbalized understanding.   Spoke with Dr. Aleen CampiPiscoya when he came into office and he advised me that he would rather the patient have the US today regardless if he has eaten due to him wanting to look at the common bile duct. I verbalized  understanding and will contact the patient.   Call made to Central Scheduling at this time [facility] and she advised me that the [facility] I verbalized understanding and will notify the patient.   Call made to patient at this time. I advised him that Dr. Aleen CampiPiscoya is wanting him to have his US completed today. I stated that [facility] He verbalized understanding and stated that he will get there as soon as possible. I thanked him for his patience.

## 2017-07-06 NOTE — Telephone Encounter (Signed)
Radiologist call and gave US report. Stated that patient's gallbladder is contracted but his bile duct was normal and all other imaging came back normal. I will advise Dr. Aleen CampiPiscoya of these results.

## 2017-07-06 NOTE — Telephone Encounter (Signed)
Kim from Radiology called back and stated that she noticed that the patient's spleen is enlarged and that they will add the addendum to the report. I verbalized understanding at this time.

## 2017-07-06 NOTE — Telephone Encounter (Signed)
Call made to patient at this time. I verbalized that his results from his US came back normal expect for his spleen being a little enlarged. I verbalized to him that I would have Dr. Aleen CampiPiscoya talk with him regarding further detail of these results. Patient verbalized understanding placed on hold to speak with Dr. Aleen CampiPiscoya.

## 2017-07-07 ENCOUNTER — Ambulatory Visit: Payer: BC Managed Care – PPO

## 2017-07-07 ENCOUNTER — Telehealth: Payer: Self-pay

## 2017-07-07 ENCOUNTER — Encounter: Payer: Self-pay | Admitting: Surgery

## 2017-07-07 NOTE — Telephone Encounter (Signed)
Call made to patient at this time. I verbalized to him that his lab have improved and that we will still have him to follow up with GI. He verbalized understanding and was grateful for the news.

## 2017-07-12 ENCOUNTER — Ambulatory Visit (INDEPENDENT_AMBULATORY_CARE_PROVIDER_SITE_OTHER): Payer: BC Managed Care – PPO | Admitting: Gastroenterology

## 2017-07-12 ENCOUNTER — Encounter: Payer: Self-pay | Admitting: Gastroenterology

## 2017-07-12 VITALS — BP 121/74 | HR 98 | Ht 67.0 in | Wt 178.0 lb

## 2017-07-12 DIAGNOSIS — Z114 Encounter for screening for human immunodeficiency virus [HIV]: Secondary | ICD-10-CM | POA: Diagnosis not present

## 2017-07-12 DIAGNOSIS — Z113 Encounter for screening for infections with a predominantly sexual mode of transmission: Secondary | ICD-10-CM

## 2017-07-12 DIAGNOSIS — R945 Abnormal results of liver function studies: Secondary | ICD-10-CM

## 2017-07-12 DIAGNOSIS — R7989 Other specified abnormal findings of blood chemistry: Secondary | ICD-10-CM

## 2017-07-12 NOTE — Progress Notes (Signed)
Wyline MoodKiran Magdalen Cabana MD, MRCP(U.K) [address]  [address]  [place], [place] [address]  Main: [phone]  Fax: [phone]   Gastroenterology Consultation  Referring Provider:     Dione Housekeeperlmedo, Mario Ernesto, * Primary Care Physician:  Dione Housekeeperlmedo, Mario Ernesto, MD Primary Gastroenterologist:  Dr. [name]  Reason for Consultation:     Elevated bilirubin         HPI:   Benjamin Green is a 22 y.o. y/o male referred for consultation & management  by Dr. Zada Finderslmedo, Joycie PeekMario Ernesto, MD.     He has been referred by Dr. Aleen CampiPiscoya for an elevated bilirubin.  He was seen by Dr. Earlene Plateravis on 06/29/2017 for a sacrococcygeal wound that underwent excision 2 months previously.  During that office visit he noticed that his eyes were yellow in color and apparently been going on for 3 weeks.  Subsequently he had a CMP drawn which showed a normal creatinine of 0.85 and albumin of 5.4 but an isolated elevated bilirubin of 7.3 with normal AST and ALT.  His INR was normal at 1.03 hepatic function panel that was repeated about 9 days later showed that the bilirubin had decreased to 3.6 with a predominant-direct hyperbilirubinemia at 3.4 with normal AST and ALT.  A CBC showed a hemoglobin of 16.2 g he had an ultrasound of the abdomen performed which showed a spleen is enlarged at 13.2 cm but no abnormalities were seen in the liver the portal vein was patent.  There is no biliary ductal dilation.  Contracted gallbladder.  No acute abdominal findings.  I do not have any other old labs to compare.   Says he has never had liver tests before. Says his eyes have "always been yellow", got more yellow when dieting and losing weight . Sometimes noticed dark urine. Never used weight loss medications, herbal medications,Green tea extracts. For recent surgery he was on antibiotics- was placed on Augmentin in 05/2017 . Takes ibuprofen twice a week for headaches. Occasional alcohol. No tattoos. 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 Once used cocaine. Same sex  sexual activity. No unintentional weight loss.   No travel outside the country.     Past Medical History:  Diagnosis Date  . Complication of anesthesia   . Headache    FREQUENT HA'S  . Pilonidal cyst with abscess     Past Surgical History:  Procedure Laterality Date  . PILONIDAL CYST EXCISION N/A 04/21/2017   Procedure: CYST EXCISION PILONIDAL EXTENSIVE;  Surgeon: Henrene DodgePiscoya, Jose, MD;  Location: [facility] ORS;  Service: General;  Laterality: N/A;  . TONSILLECTOMY      Prior to Admission medications   Medication Sig Start Date End Date Taking? Authorizing Provider  CORDRAN 0.05 % CREA Apply 1 application topically daily as needed (dermatitis).  03/27/17  Yes [provider]  ibuprofen (ADVIL,MOTRIN) 600 MG tablet Take 1 tablet (600 mg total) by mouth every 8 (eight) hours as needed for headache, mild pain or moderate pain. 04/21/17  Yes Piscoya, Jose, MD  ONEXTON 1.2-3.75 % GEL Apply 1 application topically daily.  03/24/17  Yes [provider]  RETIN-A MICRO PUMP 0.08 % GEL Apply 1 application topically at bedtime.  03/27/17  Yes [provider]  Sulfacetamide Sodium-Sulfur 10-5 % EMUL WASH FACE TWICE DAILY 07/04/17  Yes [provider]  triamcinolone cream (KENALOG) 0.1 % APPLY TO AFFECTED AREA TWICE A DAY 05/18/17  Yes [provider]    Family History  Problem Relation Age of Onset  . Healthy Mother   .  Breast cancer Maternal Aunt      Social History   Tobacco Use  . Smoking status: Former Smoker    Packs/day: 0.50    Years: 6.00    Pack years: 3.00    Types: Cigarettes    Last attempt to quit: 06/14/2016    Years since quitting: 1.0  . Smokeless tobacco: Never Used  Substance Use Topics  . Alcohol use: Yes    Comment: occasional  . Drug use: Yes    Types: Marijuana    Comment: MOSTLY 3 TIMES WEEKLY OR MAY SMOKE EVERYDAY IF HE HAS THE MONEY FOR IT    Allergies as of 07/12/2017  . (No Known Allergies)    Review of  Systems:    All systems reviewed and negative except where noted in HPI.   Physical Exam:  BP 121/74 (BP Location: Left Arm, Patient Position: Sitting, Cuff Size: Normal)   Pulse 98   Ht 5\' 7"  (1.702 m)   Wt 178 lb (80.7 kg)   BMI 27.88 kg/m  No LMP for male patient. Psych:  Alert and cooperative. Normal mood and affect. General:   Alert,  Well-developed, well-nourished, pleasant and cooperative in NAD Head:  Normocephalic and atraumatic. Eyes:  Sclera clear, no icterus.   Conjunctiva pink. Ears:  Normal auditory acuity. Nose:  No deformity, discharge, or lesions. Mouth:  No deformity or lesions,oropharynx pink & moist. Neck:  Supple; no masses or thyromegaly. Lungs:  Respirations even and unlabored.  Clear throughout to auscultation.   No wheezes, crackles, or rhonchi. No acute distress. Heart:  Regular rate and rhythm; no murmurs, clicks, rubs, or gallops. Abdomen:  Normal bowel sounds.  No bruits.  Soft, non-tender and non-distended without masses, hepatosplenomegaly or hernias noted.  No guarding or rebound tenderness.    Neurologic:  Alert and oriented x3;  grossly normal neurologically. Skin:  Intact without significant lesions or rashes. No jaundice. Lymph Nodes:  No significant cervical adenopathy. Psych:  Alert and cooperative. Normal mood and affect.  Imaging Studies: US Abdomen Complete  Addendum Date: 07/06/2017   ADDENDUM REPORT: 07/06/2017 13:25 ADDENDUM: Spleen is enlarged measuring 13.2 cm in craniocaudad dimension. Estimated volume 556 cubic cm. These results will be called to the ordering clinician or representative by the Radiologist Assistant, and communication documented in the PACS or zVision Dashboard. Electronically Signed   By: Genevive Bi M.D.   On: 07/06/2017 13:25   Result Date: 07/06/2017 CLINICAL DATA:  Atresia bile ducts. Elevated bilirubin. Occasional back pain EXAM: ABDOMEN ULTRASOUND COMPLETE COMPARISON:  None. FINDINGS: Gallbladder: Gallbladder is  contracted. No gallstones identified. Negative sonographic Murphy's sign Common bile duct: Diameter: Normal at 3 mm Liver: No focal lesion identified. Within normal limits in parenchymal echogenicity. Portal vein is patent on color Doppler imaging with normal direction of blood flow towards the liver. IVC: No abnormality visualized. Pancreas: Visualized portion unremarkable. Spleen: Size and appearance within normal limits. Right Kidney: Length: 10.8 cm. Echogenicity within normal limits. No mass or hydronephrosis visualized. Left Kidney: Length: 11.5 cm. Echogenicity within normal limits. No mass or hydronephrosis visualized. Abdominal aorta: No aneurysm visualized. Other findings: None. IMPRESSION: 1. No acute abdominal findings by ultrasound. 2. No biliary duct dilatation.  Normal common bile duct. 3. Contracted gallbladder is atypical in NPO patient. No evidence of acute cholecystitis. Electronically Signed: By: Genevive Bi M.D. On: 07/06/2017 11:40    Assessment and Plan:   Benjamin Green is a 22 y.o. y/o male has been referred for an  elevated bilirubin.  Unsure of his baseline bilirubin as we do not have any old labs to compare.  The initial bilirubin that was elevated on 06/29/2017 was a total bilirubin of 7.3 we do not have fractions available.  But with normal AST ALT INR and albumin.  Subsequently checked a week later the bilirubin had dropped to the predominant indirect hyperbilirubinemia.  Ultrasound shows an enlarged spleen at 13.2 cm.  Differentials include a drug reaction vs a hereditary hematological issue(thalasssemia , spherocytosis) vs a liver issue which causes indirect hyperbilirubinemia during periods of stress such as surgery or in his case intentional weight loss.  It would also explain the splenomegaly.    Plan  1. Peripheral blood smear 2. Autoimmune, viral hepatitis, HIV, RPR, ceruloplasmin, iron studies, a1at, celiac serology ,GGT 3. Next visit depending on the  results may need to obtain hematological consultation if felt he has a hematological issue.  Follow up in 4weeks   Dr Wyline Mood MD,MRCP(U.K)

## 2017-07-13 ENCOUNTER — Encounter: Payer: Self-pay | Admitting: Surgery

## 2017-07-17 ENCOUNTER — Telehealth: Payer: Self-pay | Admitting: Gastroenterology

## 2017-07-17 NOTE — Telephone Encounter (Signed)
Pt is calling for Lab results °

## 2017-07-18 NOTE — Telephone Encounter (Signed)
Advised patient of lab results yesterday per Dr. Tobi BastosAnna.    - He has a low haptoglobulin , enlarged spleen , indirect hyperbilirubinemia, elevated ferritin - seems like he has a hemolytic process or a hemoglobinopathy.   Suggest refer to Dr Cathie HoopsYu for evaluation . I would like to see him after seen by Dr Cathie HoopsYu    He has scheduled a visit with Dr. Cathie HoopsYu already.

## 2017-07-19 ENCOUNTER — Ambulatory Visit (INDEPENDENT_AMBULATORY_CARE_PROVIDER_SITE_OTHER): Payer: BC Managed Care – PPO | Admitting: Surgery

## 2017-07-19 ENCOUNTER — Encounter: Payer: Self-pay | Admitting: Surgery

## 2017-07-19 VITALS — BP 141/84 | HR 90 | Temp 97.7°F | Wt 178.0 lb

## 2017-07-19 DIAGNOSIS — Z09 Encounter for follow-up examination after completed treatment for conditions other than malignant neoplasm: Secondary | ICD-10-CM

## 2017-07-19 NOTE — Progress Notes (Signed)
07/19/2017  HPI: Patient is s/p pilonidal cyst excision on 12/21.  He had been seen on 2/28 for a final wound check and was noted to be jaundiced.  LFTs obtained were elevated and ultrasound was negative.  He has seen Dr. Tobi BastosAnna with GI and is due to see Dr. Cathie HoopsYu with Hematology on 3/21.   He reports that a few das ago the wound opened up again, at the inferior portion.  He denies having done any strenuous activity.  Denies any worsening pain, but describes that when it opened, he thought some purulent drainage came out, as well as some bloody discharge.  He still has some bloody discharge on the dressing.  Vital signs: BP (!) 141/84   Pulse 90   Temp 97.7 F (36.5 C) (Oral)   Wt 80.7 kg (178 lb)   BMI 27.88 kg/m    Physical Exam: Constitutional: No acute distress Skin:  Pilonidal excision site is healing well, but the inferior 1 cm of the wound has reopened.  Q-tip probing shows very superficial wound of 5 mm depth only.  No purulent drainage upon manual pressure.  The patient has hair on buttocks skin that was clipped around the wound for better cleanliness.  Silver nitrate applied to wound and dressed with gauze/tape.  Assessment/Plan: 22 yo male s/p pilonidal cyst excision  --Inferior 1 cm of wound re-opened.  Unclear if there was an infection or not, but currently the wound is healthy without purulence and without any erythema or induration.  Silver nitrate applied. --He can apply dry gauze dressing daily to wound.  Keep area clean and dry.  Will return in two weeks for another wound check.   --Encouraged to continue follow up with Hematology for his jaundice.   Howie IllJose Luis Herbie Lehrmann, MD [facility]

## 2017-07-19 NOTE — Patient Instructions (Signed)
We will see you back in 2 weeks to make sure that you are doing better.

## 2017-07-20 ENCOUNTER — Inpatient Hospital Stay: Payer: BC Managed Care – PPO

## 2017-07-20 ENCOUNTER — Encounter: Payer: Self-pay | Admitting: Oncology

## 2017-07-20 ENCOUNTER — Other Ambulatory Visit: Payer: Self-pay

## 2017-07-20 ENCOUNTER — Inpatient Hospital Stay: Payer: BC Managed Care – PPO | Attending: Oncology | Admitting: Oncology

## 2017-07-20 DIAGNOSIS — E538 Deficiency of other specified B group vitamins: Secondary | ICD-10-CM | POA: Insufficient documentation

## 2017-07-20 DIAGNOSIS — Z87891 Personal history of nicotine dependence: Secondary | ICD-10-CM | POA: Insufficient documentation

## 2017-07-20 HISTORY — DX: Deficiency of other specified B group vitamins: E53.8

## 2017-07-20 LAB — GAMMA GT: GGT: 16 IU/L (ref 0–65)

## 2017-07-20 LAB — HEPATITIS B CORE ANTIBODY, TOTAL: Hep B Core Total Ab: NEGATIVE

## 2017-07-20 LAB — HAPTOGLOBIN: Haptoglobin: <10 mg/dL — ABNORMAL LOW (ref 34–200)

## 2017-07-20 LAB — PATHOLOGIST SMEAR REVIEW
BASOS ABS: 0 10*3/uL (ref 0.0–0.2)
BASOS: 1 %
EOS (ABSOLUTE): 0.1 10*3/uL (ref 0.0–0.4)
EOS: 1 %
HEMOGLOBIN: 16.1 g/dL (ref 13.0–17.7)
Hematocrit: 44.2 % (ref 37.5–51.0)
IMMATURE GRANS (ABS): 0.1 10*3/uL (ref 0.0–0.1)
IMMATURE GRANULOCYTES: 1 %
LYMPHS ABS: 1.6 10*3/uL (ref 0.7–3.1)
Lymphs: 30 %
MCH: 31.8 pg (ref 26.6–33.0)
MCHC: 36.4 g/dL — ABNORMAL HIGH (ref 31.5–35.7)
MCV: 87 fL (ref 79–97)
MONOCYTES: 8 %
Monocytes Absolute: 0.4 10*3/uL (ref 0.1–0.9)
NEUTROS PCT: 59 %
Neutrophils Absolute: 3.1 10*3/uL (ref 1.4–7.0)
PATH REV PLTS: NORMAL
PATH REV WBC: NORMAL
PLATELETS: 237 10*3/uL (ref 150–379)
Path Rev RBC: NORMAL
RBC: 5.06 x10E6/uL (ref 4.14–5.80)
RDW: 14 % (ref 12.3–15.4)
WBC: 5.2 10*3/uL (ref 3.4–10.8)

## 2017-07-20 LAB — COMPREHENSIVE METABOLIC PANEL
A/G RATIO: 2.3 — AB (ref 1.2–2.2)
ALBUMIN: 5.2 g/dL (ref 3.5–5.5)
ALK PHOS: 86 IU/L (ref 39–117)
ALK PHOS: 73 U/L (ref 38–126)
ALT: 15 IU/L (ref 0–44)
ALT: 18 U/L (ref 17–63)
AST: 16 IU/L (ref 0–40)
AST: 22 U/L (ref 15–41)
Albumin: 5.3 g/dL — ABNORMAL HIGH (ref 3.5–5.0)
Anion gap: 9 (ref 5–15)
BUN / CREAT RATIO: 12 (ref 9–20)
BUN: 11 mg/dL (ref 6–20)
BUN: 10 mg/dL (ref 6–20)
Bilirubin Total: 4.2 mg/dL — ABNORMAL HIGH (ref 0.0–1.2)
CALCIUM: 9.8 mg/dL (ref 8.9–10.3)
CO2: 22 mmol/L (ref 20–29)
CO2: 25 mmol/L (ref 22–32)
CREATININE: 0.93 mg/dL (ref 0.76–1.27)
CREATININE: 0.77 mg/dL (ref 0.61–1.24)
Calcium: 9.9 mg/dL (ref 8.7–10.2)
Chloride: 103 mmol/L (ref 96–106)
Chloride: 104 mmol/L (ref 101–111)
GFR calc Af Amer: 133 mL/min/{1.73_m2} (ref >59)
GFR calc non Af Amer: >60 mL/min (ref >60)
GFR, EST NON AFRICAN AMERICAN: 115 mL/min/{1.73_m2} (ref >59)
GLOBULIN, TOTAL: 2.3 g/dL (ref 1.5–4.5)
GLUCOSE: 93 mg/dL (ref 65–99)
Glucose: 87 mg/dL (ref 65–99)
POTASSIUM: 4 mmol/L (ref 3.5–5.2)
Potassium: 3.9 mmol/L (ref 3.5–5.1)
SODIUM: 138 mmol/L (ref 135–145)
Sodium: 140 mmol/L (ref 134–144)
TOTAL PROTEIN: 8.6 g/dL — AB (ref 6.5–8.1)
Total Bilirubin: 3.7 mg/dL — ABNORMAL HIGH (ref 0.3–1.2)
Total Protein: 7.5 g/dL (ref 6.0–8.5)

## 2017-07-20 LAB — FERRITIN: Ferritin: 639 ng/mL — ABNORMAL HIGH (ref 30–400)

## 2017-07-20 LAB — CBC WITH DIFFERENTIAL/PLATELET
BASOS ABS: 0 10*3/uL (ref 0–0.1)
BASOS PCT: 1 %
EOS ABS: 0 10*3/uL (ref 0–0.7)
Eosinophils Relative: 1 %
HCT: 46.8 % (ref 40.0–52.0)
Hemoglobin: 16.9 g/dL (ref 13.0–18.0)
Lymphocytes Relative: 16 %
Lymphs Abs: 1.1 10*3/uL (ref 1.0–3.6)
MCH: 31.8 pg (ref 26.0–34.0)
MCHC: 36.1 g/dL — AB (ref 32.0–36.0)
MCV: 88.2 fL (ref 80.0–100.0)
MONO ABS: 0.5 10*3/uL (ref 0.2–1.0)
MONOS PCT: 8 %
NEUTROS PCT: 74 %
Neutro Abs: 5 10*3/uL (ref 1.4–6.5)
PLATELETS: 267 10*3/uL (ref 150–440)
RBC: 5.31 MIL/uL (ref 4.40–5.90)
RDW: 14.4 % (ref 11.5–14.5)
WBC: 6.6 10*3/uL (ref 3.8–10.6)

## 2017-07-20 LAB — GLIA (IGA/G) + TTG IGA
ANTIGLIADIN ABS, IGA: 4 U (ref 0–19)
GLIADIN IGG: 4 U (ref 0–19)

## 2017-07-20 LAB — ANA: ANA Titer 1: NEGATIVE

## 2017-07-20 LAB — RETICULOCYTES
RBC.: 5.39 MIL/uL (ref 4.40–5.90)
RETIC CT PCT: 2.7 % (ref 0.4–3.1)
Retic Count, Absolute: 145.5 10*3/uL (ref 19.0–183.0)

## 2017-07-20 LAB — BILIRUBIN, DIRECT: Bilirubin, Direct: 0.2 mg/dL (ref 0.1–0.5)

## 2017-07-20 LAB — TSH: TSH: 1.21 u[IU]/mL (ref 0.450–4.500)

## 2017-07-20 LAB — URINE DRUG SCREEN, QUALITATIVE (ARMC ONLY)
AMPHETAMINES, UR SCREEN: NOT DETECTED
BARBITURATES, UR SCREEN: NOT DETECTED
BENZODIAZEPINE, UR SCRN: NOT DETECTED
Cannabinoid 50 Ng, Ur ~~LOC~~: POSITIVE — AB
Cocaine Metabolite,Ur ~~LOC~~: NOT DETECTED
MDMA (Ecstasy)Ur Screen: NOT DETECTED
Methadone Scn, Ur: NOT DETECTED
OPIATE, UR SCREEN: NOT DETECTED
Phencyclidine (PCP) Ur S: NOT DETECTED
Tricyclic, Ur Screen: NOT DETECTED

## 2017-07-20 LAB — IMMUNOGLOBULINS A/E/G/M, SERUM
IGG (IMMUNOGLOBIN G), SERUM: 1102 mg/dL (ref 700–1600)
IgA/Immunoglobulin A, Serum: 185 mg/dL (ref 90–386)
IgE (Immunoglobulin E), Serum: 54 IU/mL (ref 6–495)
IgM (Immunoglobulin M), Srm: 82 mg/dL (ref 20–172)

## 2017-07-20 LAB — CHLAMYDIA/GONOCOCCUS/TRICHOMONAS, NAA
Chlamydia by NAA: NEGATIVE
GONOCOCCUS BY NAA: NEGATIVE
TRICH VAG BY NAA: NEGATIVE

## 2017-07-20 LAB — IRON AND TIBC
IRON SATURATION: 23 % (ref 15–55)
IRON: 75 ug/dL (ref 38–169)
TIBC: 321 ug/dL (ref 250–450)
UIBC: 246 ug/dL (ref 111–343)

## 2017-07-20 LAB — PROTEIN ELECTROPHORESIS, SERUM
A/G Ratio: 1.3 (ref 0.7–1.7)
ALBUMIN ELP: 4.3 g/dL (ref 2.9–4.4)
ALPHA 1: 0.3 g/dL (ref 0.0–0.4)
ALPHA 2: 0.5 g/dL (ref 0.4–1.0)
BETA: 1.2 g/dL (ref 0.7–1.3)
GLOBULIN, TOTAL: 3.2 g/dL (ref 2.2–3.9)
Gamma Globulin: 1.2 g/dL (ref 0.4–1.8)

## 2017-07-20 LAB — ANTI-MICROSOMAL ANTIBODY LIVER / KIDNEY: LKM1 AB: 2.7 U (ref 0.0–20.0)

## 2017-07-20 LAB — HEPATITIS B SURFACE ANTIGEN: Hepatitis B Surface Ag: NEGATIVE

## 2017-07-20 LAB — HEPATITIS C ANTIBODY: Hep C Virus Ab: 0.2 s/co ratio (ref 0.0–0.9)

## 2017-07-20 LAB — HEPATITIS B E ANTIBODY: Hep B E Ab: NEGATIVE

## 2017-07-20 LAB — LACTATE DEHYDROGENASE: LDH: 133 U/L (ref 98–192)

## 2017-07-20 LAB — HSV(HERPES SIMPLEX VRS) I + II AB-IGG: HSV 2 IgG, Type Spec: <0.91 index (ref 0.00–0.90)

## 2017-07-20 LAB — BILIRUBIN, FRACTIONATED(TOT/DIR/INDIR)
BILIRUBIN INDIRECT: 3.96 mg/dL — AB (ref 0.10–0.80)
BILIRUBIN, DIRECT: 0.24 mg/dL (ref 0.00–0.40)

## 2017-07-20 LAB — VITAMIN B12: VITAMIN B 12: 132 pg/mL — AB (ref 180–914)

## 2017-07-20 LAB — HEPATITIS B SURFACE ANTIBODY,QUALITATIVE: HEP B SURFACE AB, QUAL: NONREACTIVE

## 2017-07-20 LAB — CERULOPLASMIN: CERULOPLASMIN: 24.5 mg/dL (ref 16.0–31.0)

## 2017-07-20 LAB — RPR: RPR Ser Ql: NONREACTIVE

## 2017-07-20 LAB — ANTI-SMOOTH MUSCLE ANTIBODY, IGG: SMOOTH MUSCLE AB: 8 U (ref 0–19)

## 2017-07-20 LAB — HEPATITIS A ANTIBODY, TOTAL: Hep A Total Ab: NEGATIVE

## 2017-07-20 LAB — HIV ANTIBODY (ROUTINE TESTING W REFLEX): HIV Screen 4th Generation wRfx: NONREACTIVE

## 2017-07-20 LAB — DAT, POLYSPECIFIC AHG (ARMC ONLY): POLYSPECIFIC AHG TEST: NEGATIVE

## 2017-07-20 LAB — ALPHA-1-ANTITRYPSIN: A1 ANTITRYPSIN: 151 mg/dL (ref 90–200)

## 2017-07-20 NOTE — Progress Notes (Signed)
Patient here today as a new patient  

## 2017-07-20 NOTE — Progress Notes (Signed)
Hematology/Oncology Consult note [facility] Telephone:([phone]) [phone] [phone]   Patient Care Team: Dione Housekeeperlmedo, Mario Ernesto, MD as PCP - General (Family Medicine)  REFERRING PROVIDER: Corrinne Eagler.Anna Kiran CHIEF COMPLAINTS/PURPOSE OF CONSULTATION:  Evaluation of hyperbilirubinemia, possible hemolysis.   HISTORY OF PRESENTING ILLNESS:  Benjamin ShutterBenjamin D Green is a  22 y.o.  male with PMH listed below who was referred to me for evaluation of hyperbilirubinemia, possible hemolysis.  Patient had pilonidal cyst excision on 12/21.  Patient was noted to have jaundice.  Liver function tests showed isolated elevated bilirubin of 7.3 with normal AST and ALT.  Coags are normal.  Liver function test was repeated 9 days later which again showed bilirubinemia. Hemoglobin 16, US abdomen showed splenomegaly with no liver abnormality. No older labs to compare.   Patient reports "eyes turn yellow" when dieting and losing weight. He has intentionally lost 30 pounds recently. Denies any herbal medication, recreational drug except marijuana. He was on antibiotics in January 2019. No travel outside of [place], no smoking, social drinking .     Review of Systems  Constitutional: Negative for chills, diaphoresis, fever and malaise/fatigue.  HENT: Negative for hearing loss and nosebleeds.   Eyes: Negative for photophobia.  Respiratory: Negative for cough and hemoptysis.   Cardiovascular: Negative for chest pain, palpitations and orthopnea.  Gastrointestinal: Negative for abdominal pain, diarrhea, nausea and vomiting.  Genitourinary: Negative for dysuria and urgency.  Musculoskeletal: Negative for myalgias and neck pain.  Skin: Negative for rash.  Neurological: Negative for dizziness and headaches.  Endo/Heme/Allergies: Does not bruise/bleed easily.  Psychiatric/Behavioral: Negative for depression and suicidal ideas.    MEDICAL HISTORY:  Past Medical History:  Diagnosis Date  . Complication  of anesthesia   . Headache    FREQUENT HA'S  . Pilonidal cyst with abscess     SURGICAL HISTORY: Past Surgical History:  Procedure Laterality Date  . MIDDLE EAR SURGERY    . PILONIDAL CYST EXCISION N/A 04/21/2017   Procedure: CYST EXCISION PILONIDAL EXTENSIVE;  Surgeon: Henrene DodgePiscoya, Jose, MD;  Location: [facility] ORS;  Service: General;  Laterality: N/A;  . TONSILLECTOMY      SOCIAL HISTORY: Social History   Socioeconomic History  . Marital status: Single    Spouse name: Not on file  . Number of children: Not on file  . Years of education: Not on file  . Highest education level: Not on file  Occupational History  . Not on file  Social Needs  . Financial resource strain: Not on file  . Food insecurity:    Worry: Not on file    Inability: Not on file  . Transportation needs:    Medical: Not on file    Non-medical: Not on file  Tobacco Use  . Smoking status: Former Smoker    Packs/day: 0.50    Years: 6.00    Pack years: 3.00    Types: Cigarettes    Last attempt to quit: 06/14/2016    Years since quitting: 1.0  . Smokeless tobacco: Never Used  Substance and Sexual Activity  . Alcohol use: Yes    Comment: occasional - once a week   . Drug use: Yes    Types: Marijuana    Comment: MOSTLY 2 TIMES WEEKLY OR MAY SMOKE EVERYDAY IF HE HAS THE MONEY FOR IT  . Sexual activity: Yes  Lifestyle  . Physical activity:    Days per week: Not on file    Minutes per session: Not on file  . Stress: Not  on file  Relationships  . Social connections:    Talks on phone: Not on file    Gets together: Not on file    Attends religious service: Not on file    Active member of club or organization: Not on file    Attends meetings of clubs or organizations: Not on file    Relationship status: Not on file  . Intimate partner violence:    Fear of current or ex partner: Not on file    Emotionally abused: Not on file    Physically abused: Not on file    Forced sexual activity: Not on file  Other  Topics Concern  . Not on file  Social History Narrative  . Not on file    FAMILY HISTORY: Family History  Problem Relation Age of Onset  . Healthy Mother   . Breast cancer Maternal Aunt     ALLERGIES:  has No Known Allergies.  MEDICATIONS:  Current Outpatient Medications  Medication Sig Dispense Refill  . CORDRAN 0.05 % CREA Apply 1 application topically daily as needed (dermatitis).     Marland Kitchen ibuprofen (ADVIL,MOTRIN) 600 MG tablet Take 1 tablet (600 mg total) by mouth every 8 (eight) hours as needed for headache, mild pain or moderate pain. 30 tablet 0  . ONEXTON 1.2-3.75 % GEL Apply 1 application topically daily.     Marland Kitchen RETIN-A MICRO PUMP 0.08 % GEL Apply 1 application topically at bedtime.     . Sulfacetamide Sodium-Sulfur 10-5 % EMUL WASH FACE TWICE DAILY  5  . triamcinolone cream (KENALOG) 0.1 % APPLY TO AFFECTED AREA TWICE A DAY  0   No current facility-administered medications for this visit.      PHYSICAL EXAMINATION: ECOG PERFORMANCE STATUS: 0 - Asymptomatic Vitals:   07/20/17 1030  BP: 116/75  Pulse: 66  Temp: 98.5 F (36.9 C)   Filed Weights   07/20/17 1030  Weight: 177 lb 12.8 oz (80.7 kg)    Physical Exam  Constitutional: He is oriented to person, place, and time and well-developed, well-nourished, and in no distress. No distress.  HENT:  Head: Normocephalic and atraumatic.  Eyes: Pupils are equal, round, and reactive to light. EOM are normal.  Mild jaudice  Neck: Normal range of motion. Neck supple.  Cardiovascular: Normal rate, regular rhythm and normal heart sounds.  No murmur heard. Pulmonary/Chest: Effort normal.  Abdominal: Soft. Bowel sounds are normal.  Musculoskeletal: Normal range of motion. He exhibits no edema.  Neurological: He is alert and oriented to person, place, and time. No cranial nerve deficit.  Skin: Skin is warm and dry. No erythema.  Psychiatric: Affect and judgment normal.     LABORATORY DATA:  I have reviewed the data as  listed Lab Results  Component Value Date   WBC 5.2 07/13/2017   HGB 16.1 07/13/2017   HCT 44.2 07/13/2017   MCV 87 07/13/2017   PLT 237 07/13/2017   Recent Labs    06/29/17 1036 07/06/17 1325 07/13/17 0833  NA 138  --  140  K 4.0  --  4.0  CL 104  --  103  CO2 21*  --  22  GLUCOSE 88  --  87  BUN 13  --  11  CREATININE 0.85  --  0.93  CALCIUM 10.1  --  9.9  GFRNONAA >60  --  115  GFRAA >60  --  133  PROT 8.5* 8.1 7.5  ALBUMIN 5.4* 5.0 5.2  AST 25 19 16  ALT 21 16* 15  ALKPHOS 70 77 86  BILITOT 7.3* 3.6* 4.2*  BILIDIR  --  0.2 0.24  IBILI  --  3.4* 3.96*       ASSESSMENT & PLAN:  1. Hyperbilirubinemia   2. Indirect hyperbilirubinemia    Rule out hemolysis.  Check smear, LDH, B12, Folate, hemoglobinopathy, retic, urine hemosiderin, plasma hemoglobin,  repeat cbc, CMP, haptoglobin. Check DAT.  If all negative, will check PNH, PCH, gilbert syndrome genetic testing (UGT1A1 gene), RBC enzyme deficiency.  .   Addendum: DAT negative, normal LDH, Vitamin B12 decreased. Will schedule patient to start vitamin B12 IM injection daily x 5 followed by weekly x 4.  All questions were answered. The patient knows to call the clinic with any problems questions or concerns.  Return of visit: 2 weeks.  Thank you for this kind referral and the opportunity to participate in the care of this patient. A copy of today's note is routed to referring provider    Rickard Patience, MD, PhD Hematology Oncology [facility] Pager- [phone] 07/20/2017

## 2017-07-21 ENCOUNTER — Encounter: Payer: Self-pay | Admitting: Gastroenterology

## 2017-07-21 ENCOUNTER — Telehealth: Payer: Self-pay | Admitting: *Deleted

## 2017-07-21 LAB — HEMOGLOBIN FREE, PLASMA

## 2017-07-21 LAB — HEMOGLOBINOPATHY EVALUATION
HGB A2 QUANT: 2.3 % (ref 1.8–3.2)
HGB A: 97.7 % (ref 96.4–98.8)
HGB F QUANT: 0 % (ref 0.0–2.0)
HGB S QUANTITAION: 0 %
HGB VARIANT: 0 %
Hgb C: 0 %

## 2017-07-21 LAB — PATHOLOGIST SMEAR REVIEW: Path Review: NEGATIVE

## 2017-07-21 LAB — HAPTOGLOBIN: Haptoglobin: 20 mg/dL — ABNORMAL LOW (ref 34–200)

## 2017-07-21 LAB — FOLATE: Folate: 10.4 ng/mL (ref >5.9)

## 2017-07-21 NOTE — Telephone Encounter (Signed)
msg sent to scheduling team to arrange for b12 injections.

## 2017-07-21 NOTE — Telephone Encounter (Signed)
-----   Message from Rickard PatienceZhou Yu, MD sent at 07/20/2017  9:08 PM EDT ----- Herbert SetaHeather,  Please arrange patient to receive B12 injection daily x 5 followed by weekly x 4. Thank you.   Janyth ContesZhou

## 2017-07-24 ENCOUNTER — Inpatient Hospital Stay: Payer: BC Managed Care – PPO

## 2017-07-24 ENCOUNTER — Encounter: Payer: Self-pay | Admitting: Gastroenterology

## 2017-07-24 DIAGNOSIS — E538 Deficiency of other specified B group vitamins: Secondary | ICD-10-CM

## 2017-07-24 LAB — HEMOSIDERIN, URINE: HEMOSIDERIN QUAL UR: NEGATIVE

## 2017-07-24 MED ORDER — CYANOCOBALAMIN 1000 MCG/ML IJ SOLN
1000.0000 ug | Freq: Once | INTRAMUSCULAR | Status: AC
  Administered 2017-07-24: 1000 ug via INTRAMUSCULAR

## 2017-07-25 ENCOUNTER — Encounter: Payer: Self-pay | Admitting: Oncology

## 2017-07-25 ENCOUNTER — Inpatient Hospital Stay: Payer: BC Managed Care – PPO

## 2017-07-25 ENCOUNTER — Encounter: Payer: Self-pay | Admitting: Gastroenterology

## 2017-07-25 DIAGNOSIS — E538 Deficiency of other specified B group vitamins: Secondary | ICD-10-CM

## 2017-07-25 MED ORDER — CYANOCOBALAMIN 1000 MCG/ML IJ SOLN
1000.0000 ug | Freq: Once | INTRAMUSCULAR | Status: AC
  Administered 2017-07-25: 1000 ug via INTRAMUSCULAR
  Filled 2017-07-25: qty 1

## 2017-07-26 ENCOUNTER — Inpatient Hospital Stay: Payer: BC Managed Care – PPO

## 2017-07-26 DIAGNOSIS — E538 Deficiency of other specified B group vitamins: Secondary | ICD-10-CM

## 2017-07-26 MED ORDER — CYANOCOBALAMIN 1000 MCG/ML IJ SOLN
1000.0000 ug | Freq: Once | INTRAMUSCULAR | Status: AC
  Administered 2017-07-26: 1000 ug via INTRAMUSCULAR

## 2017-07-27 ENCOUNTER — Inpatient Hospital Stay: Payer: BC Managed Care – PPO

## 2017-07-27 DIAGNOSIS — E538 Deficiency of other specified B group vitamins: Secondary | ICD-10-CM

## 2017-07-27 MED ORDER — CYANOCOBALAMIN 1000 MCG/ML IJ SOLN
1000.0000 ug | Freq: Once | INTRAMUSCULAR | Status: AC
  Administered 2017-07-27: 1000 ug via INTRAMUSCULAR

## 2017-07-28 ENCOUNTER — Inpatient Hospital Stay: Payer: BC Managed Care – PPO

## 2017-07-28 ENCOUNTER — Encounter: Payer: Self-pay | Admitting: Gastroenterology

## 2017-07-28 DIAGNOSIS — E538 Deficiency of other specified B group vitamins: Secondary | ICD-10-CM

## 2017-07-28 MED ORDER — CYANOCOBALAMIN 1000 MCG/ML IJ SOLN
1000.0000 ug | Freq: Once | INTRAMUSCULAR | Status: AC
  Administered 2017-07-28: 1000 ug via INTRAMUSCULAR
  Filled 2017-07-28: qty 1

## 2017-08-01 ENCOUNTER — Ambulatory Visit: Payer: Self-pay | Admitting: Surgery

## 2017-08-02 ENCOUNTER — Ambulatory Visit: Payer: Self-pay | Admitting: Surgery

## 2017-08-03 ENCOUNTER — Inpatient Hospital Stay: Payer: BC Managed Care – PPO | Attending: Oncology | Admitting: Oncology

## 2017-08-03 ENCOUNTER — Encounter: Payer: Self-pay | Admitting: Oncology

## 2017-08-03 ENCOUNTER — Inpatient Hospital Stay: Payer: BC Managed Care – PPO

## 2017-08-03 ENCOUNTER — Other Ambulatory Visit: Payer: Self-pay

## 2017-08-03 VITALS — BP 120/63 | HR 73 | Wt 178.9 lb

## 2017-08-03 DIAGNOSIS — Z87891 Personal history of nicotine dependence: Secondary | ICD-10-CM | POA: Diagnosis not present

## 2017-08-03 DIAGNOSIS — E538 Deficiency of other specified B group vitamins: Secondary | ICD-10-CM | POA: Diagnosis not present

## 2017-08-03 DIAGNOSIS — F1290 Cannabis use, unspecified, uncomplicated: Secondary | ICD-10-CM | POA: Diagnosis not present

## 2017-08-03 MED ORDER — CYANOCOBALAMIN 1000 MCG/ML IJ SOLN
1000.0000 ug | Freq: Once | INTRAMUSCULAR | Status: AC
  Administered 2017-08-03: 1000 ug via INTRAMUSCULAR

## 2017-08-03 NOTE — Progress Notes (Signed)
Patient here today for follow up.   

## 2017-08-03 NOTE — Progress Notes (Signed)
Hematology/Oncology Follow up note [facility] [phone] [phone]   Patient Care Team: Dione Housekeeper, MD as PCP - General (Family Medicine)  REFERRING PROVIDER: Corrinne Eagle CHIEF COMPLAINTS/PURPOSE OF CONSULTATION:  Evaluation of hyperbilirubinemia, possible hemolysis.   HISTORY OF PRESENTING ILLNESS:  Benjamin Green is a  23 y.o.  male with PMH listed below who was referred to me for evaluation of hyperbilirubinemia, possible hemolysis.  Patient had pilonidal cyst excision on 12/21.  Patient was noted to have jaundice.  Liver function tests showed isolated elevated bilirubin of 7.3 with normal AST and ALT.  Coags are normal.  Liver function test was repeated 9 days later which again showed bilirubinemia. Hemoglobin 16, US abdomen showed splenomegaly with no liver abnormality. No older labs to compare.   Patient reports "eyes turn yellow" when dieting and losing weight. He has intentionally lost 30 pounds recently. Denies any herbal medication, recreational drug except marijuana. He was on antibiotics in January 2019. No travel outside of [place], no smoking, social drinking .   INTERVAL HISTORY Benjamin Green is a 22 y.o. male who has above history reviewed by me today presents for follow up visit for management of hyperbilirubinemia. He has been started on Vitamin B12 injections. Feel better. No new complaints.    Review of Systems  Constitutional: Negative for chills, diaphoresis, fever and malaise/fatigue.  HENT: Negative for hearing loss and nosebleeds.   Eyes: Negative for photophobia.  Respiratory: Negative for cough and hemoptysis.   Cardiovascular: Negative for chest pain, palpitations and orthopnea.  Gastrointestinal: Negative for abdominal pain, diarrhea, nausea and vomiting.  Genitourinary: Negative for dysuria and urgency.  Musculoskeletal: Negative for myalgias and neck pain.  Skin: Negative for rash.    Neurological: Negative for dizziness and headaches.  Endo/Heme/Allergies: Does not bruise/bleed easily.  Psychiatric/Behavioral: Negative for depression and suicidal ideas.    MEDICAL HISTORY:  Past Medical History:  Diagnosis Date  . Complication of anesthesia   . Headache    FREQUENT HA'S  . Pilonidal cyst with abscess   . Vitamin B12 deficiency 07/20/2017    SURGICAL HISTORY: Past Surgical History:  Procedure Laterality Date  . MIDDLE EAR SURGERY    . PILONIDAL CYST EXCISION N/A 04/21/2017   Procedure: CYST EXCISION PILONIDAL EXTENSIVE;  Surgeon: Henrene Dodge, MD;  Location: [facility] ORS;  Service: General;  Laterality: N/A;  . TONSILLECTOMY      SOCIAL HISTORY: Social History   Socioeconomic History  . Marital status: Single    Spouse name: Not on file  . Number of children: Not on file  . Years of education: Not on file  . Highest education level: Not on file  Occupational History  . Not on file  Social Needs  . Financial resource strain: Not on file  . Food insecurity:    Worry: Not on file    Inability: Not on file  . Transportation needs:    Medical: Not on file    Non-medical: Not on file  Tobacco Use  . Smoking status: Former Smoker    Packs/day: 0.50    Years: 6.00    Pack years: 3.00    Types: Cigarettes    Last attempt to quit: 06/14/2016    Years since quitting: 1.1  . Smokeless tobacco: Never Used  Substance and Sexual Activity  . Alcohol use: Yes    Comment: occasional - once a week   . Drug use: Yes    Types: Marijuana  Comment: MOSTLY 2 TIMES WEEKLY OR MAY SMOKE EVERYDAY IF HE HAS THE MONEY FOR IT  . Sexual activity: Yes  Lifestyle  . Physical activity:    Days per week: Not on file    Minutes per session: Not on file  . Stress: Not on file  Relationships  . Social connections:    Talks on phone: Not on file    Gets together: Not on file    Attends religious service: Not on file    Active member of club or organization: Not on file     Attends meetings of clubs or organizations: Not on file    Relationship status: Not on file  . Intimate partner violence:    Fear of current or ex partner: Not on file    Emotionally abused: Not on file    Physically abused: Not on file    Forced sexual activity: Not on file  Other Topics Concern  . Not on file  Social History Narrative  . Not on file    FAMILY HISTORY: Family History  Problem Relation Age of Onset  . Healthy Mother   . Breast cancer Maternal Aunt     ALLERGIES:  has No Known Allergies.  MEDICATIONS:  Current Outpatient Medications  Medication Sig Dispense Refill  . CORDRAN 0.05 % CREA Apply 1 application topically daily as needed (dermatitis).     Marland Kitchen. ibuprofen (ADVIL,MOTRIN) 600 MG tablet Take 1 tablet (600 mg total) by mouth every 8 (eight) hours as needed for headache, mild pain or moderate pain. 30 tablet 0  . ONEXTON 1.2-3.75 % GEL Apply 1 application topically daily.     Marland Kitchen. RETIN-A MICRO PUMP 0.08 % GEL Apply 1 application topically at bedtime.     . Sulfacetamide Sodium-Sulfur 10-5 % EMUL WASH FACE TWICE DAILY  5  . triamcinolone cream (KENALOG) 0.1 % APPLY TO AFFECTED AREA TWICE A DAY  0   No current facility-administered medications for this visit.      PHYSICAL EXAMINATION: ECOG PERFORMANCE STATUS: 0 - Asymptomatic Vitals:   08/03/17 1102  BP: 120/63  Pulse: 73   Filed Weights   08/03/17 1102  Weight: 178 lb 14.5 oz (81.1 kg)    Physical Exam  Constitutional: He is oriented to person, place, and time and well-developed, well-nourished, and in no distress. No distress.  HENT:  Head: Normocephalic and atraumatic.  Eyes: Pupils are equal, round, and reactive to light. EOM are normal.  Mild jaudice  Neck: Normal range of motion. Neck supple.  Cardiovascular: Normal rate, regular rhythm and normal heart sounds.  No murmur heard. Pulmonary/Chest: Effort normal.  Abdominal: Soft. Bowel sounds are normal.  Musculoskeletal: Normal range  of motion. He exhibits no edema.  Neurological: He is alert and oriented to person, place, and time. No cranial nerve deficit.  Skin: Skin is warm and dry. No erythema.  Psychiatric: Affect and judgment normal.     LABORATORY DATA:  I have reviewed the data as listed Lab Results  Component Value Date   WBC 6.6 07/20/2017   HGB 16.9 07/20/2017   HCT 46.8 07/20/2017   MCV 88.2 07/20/2017   PLT 267 07/20/2017   Recent Labs    06/29/17 1036 07/06/17 1325 07/13/17 0833 07/20/17 1103  NA 138  --  140 138  K 4.0  --  4.0 3.9  CL 104  --  103 104  CO2 21*  --  22 25  GLUCOSE 88  --  87 93  BUN 13  --  11 10  CREATININE 0.85  --  0.93 0.77  CALCIUM 10.1  --  9.9 9.8  GFRNONAA >60  --  115 >60  GFRAA >60  --  133 >60  PROT 8.5* 8.1 7.5 8.6*  ALBUMIN 5.4* 5.0 5.2 5.3*  AST 25 19 16 22   ALT 21 16* 15 18  ALKPHOS 70 77 86 73  BILITOT 7.3* 3.6* 4.2* 3.7*  BILIDIR  --  0.2 0.24 0.2  IBILI  --  3.4* 3.96*  --        ASSESSMENT & PLAN:  1. Vitamin B12 deficiency   2. Hyperbilirubinemia   Likely ineffective hematopoiesis/hemolysis due to B12 deficiency.  Continue parental B12 IM injections. Repeat B12 level at next visit.  Will check intrinsic antibody and anti parietal antibody.  Repeat cbc and cmp in 2 weeks.  All questions were answered. The patient knows to call the clinic with any problems questions or concerns.  Return of visit: 2 weeks.   Rickard Patience, MD, PhD Hematology Oncology [facility] Pager- [phone] 08/03/2017

## 2017-08-10 ENCOUNTER — Inpatient Hospital Stay: Payer: BC Managed Care – PPO

## 2017-08-10 VITALS — BP 121/70 | HR 65 | Temp 96.8°F | Resp 18

## 2017-08-10 DIAGNOSIS — E538 Deficiency of other specified B group vitamins: Secondary | ICD-10-CM

## 2017-08-10 MED ORDER — CYANOCOBALAMIN 1000 MCG/ML IJ SOLN
1000.0000 ug | Freq: Once | INTRAMUSCULAR | Status: AC
  Administered 2017-08-10: 1000 ug via INTRAMUSCULAR

## 2017-08-16 ENCOUNTER — Inpatient Hospital Stay: Payer: BC Managed Care – PPO

## 2017-08-16 ENCOUNTER — Ambulatory Visit: Payer: BC Managed Care – PPO | Admitting: Gastroenterology

## 2017-08-16 DIAGNOSIS — E538 Deficiency of other specified B group vitamins: Secondary | ICD-10-CM | POA: Diagnosis not present

## 2017-08-16 LAB — CBC WITH DIFFERENTIAL/PLATELET
BASOS ABS: 0.1 10*3/uL (ref 0–0.1)
BASOS PCT: 1 %
Eosinophils Absolute: 0 10*3/uL (ref 0–0.7)
Eosinophils Relative: 1 %
HEMATOCRIT: 48.1 % (ref 40.0–52.0)
HEMOGLOBIN: 17.1 g/dL (ref 13.0–18.0)
LYMPHS PCT: 30 %
Lymphs Abs: 1.7 10*3/uL (ref 1.0–3.6)
MCH: 31.6 pg (ref 26.0–34.0)
MCHC: 35.6 g/dL (ref 32.0–36.0)
MCV: 88.8 fL (ref 80.0–100.0)
Monocytes Absolute: 0.5 10*3/uL (ref 0.2–1.0)
Monocytes Relative: 9 %
NEUTROS ABS: 3.4 10*3/uL (ref 1.4–6.5)
NEUTROS PCT: 59 %
Platelets: 242 10*3/uL (ref 150–440)
RBC: 5.42 MIL/uL (ref 4.40–5.90)
RDW: 13.4 % (ref 11.5–14.5)
WBC: 5.8 10*3/uL (ref 3.8–10.6)

## 2017-08-16 LAB — COMPREHENSIVE METABOLIC PANEL
ALT: 24 U/L (ref 17–63)
AST: 24 U/L (ref 15–41)
Albumin: 5.1 g/dL — ABNORMAL HIGH (ref 3.5–5.0)
Alkaline Phosphatase: 75 U/L (ref 38–126)
Anion gap: 9 (ref 5–15)
BUN: 13 mg/dL (ref 6–20)
CHLORIDE: 103 mmol/L (ref 101–111)
CO2: 25 mmol/L (ref 22–32)
CREATININE: 0.82 mg/dL (ref 0.61–1.24)
Calcium: 9.4 mg/dL (ref 8.9–10.3)
GFR calc Af Amer: >60 mL/min (ref >60)
GLUCOSE: 94 mg/dL (ref 65–99)
Potassium: 3.8 mmol/L (ref 3.5–5.1)
Sodium: 137 mmol/L (ref 135–145)
Total Bilirubin: 3.2 mg/dL — ABNORMAL HIGH (ref 0.3–1.2)
Total Protein: 8.2 g/dL — ABNORMAL HIGH (ref 6.5–8.1)

## 2017-08-16 LAB — VITAMIN B12: Vitamin B-12: 355 pg/mL (ref 180–914)

## 2017-08-17 ENCOUNTER — Ambulatory Visit: Payer: Self-pay | Admitting: Surgery

## 2017-08-17 ENCOUNTER — Encounter: Payer: Self-pay | Admitting: Oncology

## 2017-08-17 ENCOUNTER — Inpatient Hospital Stay: Payer: BC Managed Care – PPO | Admitting: Oncology

## 2017-08-17 ENCOUNTER — Other Ambulatory Visit: Payer: Self-pay

## 2017-08-17 ENCOUNTER — Inpatient Hospital Stay: Payer: BC Managed Care – PPO

## 2017-08-17 VITALS — BP 108/61 | HR 64 | Temp 98.1°F | Wt 179.7 lb

## 2017-08-17 DIAGNOSIS — Z87891 Personal history of nicotine dependence: Secondary | ICD-10-CM | POA: Diagnosis not present

## 2017-08-17 DIAGNOSIS — E538 Deficiency of other specified B group vitamins: Secondary | ICD-10-CM

## 2017-08-17 LAB — ANTI-PARIETAL ANTIBODY: PARIETAL CELL ANTIBODY-IGG: 13.9 U (ref 0.0–20.0)

## 2017-08-17 LAB — INTRINSIC FACTOR ANTIBODIES: Intrinsic Factor: 1 AU/mL (ref 0.0–1.1)

## 2017-08-17 MED ORDER — CYANOCOBALAMIN 1000 MCG/ML IJ SOLN
INTRAMUSCULAR | Status: AC
  Filled 2017-08-17: qty 1

## 2017-08-17 MED ORDER — CYANOCOBALAMIN 1000 MCG/ML IJ SOLN
1000.0000 ug | Freq: Once | INTRAMUSCULAR | Status: AC
  Administered 2017-08-17: 1000 ug via INTRAMUSCULAR

## 2017-08-17 NOTE — Progress Notes (Signed)
Patient here today for follow up.  Patient states no new concerns today  

## 2017-08-17 NOTE — Progress Notes (Signed)
Hematology/Oncology Follow up note [facility] [phone] [phone]   Patient Care Team: Dione Housekeeper, MD as PCP - General (Family Medicine)  REFERRING PROVIDER: Corrinne Eagle  REASON FOR VISIT Follow up for treatment of : Vitamin B12 deficiency and hyperbilirubinemia.    HISTORY OF PRESENTING ILLNESS:  Benjamin Green is a  22 y.o.  male with PMH listed below who was referred to me for evaluation of hyperbilirubinemia, possible hemolysis.  Patient had pilonidal cyst excision on 12/21.  Patient was noted to have jaundice.  Liver function tests showed isolated elevated bilirubin of 7.3 with normal AST and ALT.  Coags are normal.  Liver function test was repeated 9 days later which again showed bilirubinemia. Hemoglobin 16, US abdomen showed splenomegaly with no liver abnormality. No older labs to compare.   Patient reports "eyes turn yellow" when dieting and losing weight. He has intentionally lost 30 pounds recently. Denies any herbal medication, recreational drug except marijuana. He was on antibiotics in January 2019. No travel outside of [place], no smoking, social drinking .   INTERVAL HISTORY Benjamin Green is a 22 y.o. male who has above history reviewed by me today presents for follow up visit for management of vitamin b12 deficiency/ hyperbilirubinemia. He has been started on Vitamin B12 injections. Currently on weekly B12 injection. Feels at baseline.    Review of Systems  Constitutional: Negative for chills, diaphoresis, fever and malaise/fatigue.  HENT: Negative for hearing loss and nosebleeds.   Eyes: Negative for photophobia and pain.  Respiratory: Negative for cough and hemoptysis.   Cardiovascular: Negative for chest pain, palpitations and orthopnea.  Gastrointestinal: Negative for abdominal pain, diarrhea, nausea and vomiting.  Genitourinary: Negative for dysuria and urgency.  Musculoskeletal: Negative for myalgias  and neck pain.  Skin: Negative for rash.  Neurological: Negative for dizziness, sensory change and headaches.  Endo/Heme/Allergies: Does not bruise/bleed easily.  Psychiatric/Behavioral: Negative for depression and suicidal ideas.    MEDICAL HISTORY:  Past Medical History:  Diagnosis Date  . Complication of anesthesia   . Headache    FREQUENT HA'S  . Pilonidal cyst with abscess   . Vitamin B12 deficiency 07/20/2017    SURGICAL HISTORY: Past Surgical History:  Procedure Laterality Date  . MIDDLE EAR SURGERY    . PILONIDAL CYST EXCISION N/A 04/21/2017   Procedure: CYST EXCISION PILONIDAL EXTENSIVE;  Surgeon: Henrene Dodge, MD;  Location: [facility] ORS;  Service: General;  Laterality: N/A;  . TONSILLECTOMY      SOCIAL HISTORY: Social History   Socioeconomic History  . Marital status: Single    Spouse name: Not on file  . Number of children: Not on file  . Years of education: Not on file  . Highest education level: Not on file  Occupational History  . Not on file  Social Needs  . Financial resource strain: Not on file  . Food insecurity:    Worry: Not on file    Inability: Not on file  . Transportation needs:    Medical: Not on file    Non-medical: Not on file  Tobacco Use  . Smoking status: Former Smoker    Packs/day: 0.50    Years: 6.00    Pack years: 3.00    Types: Cigarettes    Last attempt to quit: 06/14/2016    Years since quitting: 1.1  . Smokeless tobacco: Never Used  Substance and Sexual Activity  . Alcohol use: Yes    Comment: occasional - once a  week   . Drug use: Yes    Types: Marijuana    Comment: MOSTLY 2 TIMES WEEKLY OR MAY SMOKE EVERYDAY IF HE HAS THE MONEY FOR IT  . Sexual activity: Yes  Lifestyle  . Physical activity:    Days per week: Not on file    Minutes per session: Not on file  . Stress: Not on file  Relationships  . Social connections:    Talks on phone: Not on file    Gets together: Not on file    Attends religious service: Not on  file    Active member of club or organization: Not on file    Attends meetings of clubs or organizations: Not on file    Relationship status: Not on file  . Intimate partner violence:    Fear of current or ex partner: Not on file    Emotionally abused: Not on file    Physically abused: Not on file    Forced sexual activity: Not on file  Other Topics Concern  . Not on file  Social History Narrative  . Not on file    FAMILY HISTORY: Family History  Problem Relation Age of Onset  . Healthy Mother   . Breast cancer Maternal Aunt     ALLERGIES:  has No Known Allergies.  MEDICATIONS:  Current Outpatient Medications  Medication Sig Dispense Refill  . CORDRAN 0.05 % CREA Apply 1 application topically daily as needed (dermatitis).     Marland Kitchen ibuprofen (ADVIL,MOTRIN) 600 MG tablet Take 1 tablet (600 mg total) by mouth every 8 (eight) hours as needed for headache, mild pain or moderate pain. 30 tablet 0  . ONEXTON 1.2-3.75 % GEL Apply 1 application topically daily.     Marland Kitchen RETIN-A MICRO PUMP 0.08 % GEL Apply 1 application topically at bedtime.     . Sulfacetamide Sodium-Sulfur 10-5 % EMUL WASH FACE TWICE DAILY  5  . triamcinolone cream (KENALOG) 0.1 % APPLY TO AFFECTED AREA TWICE A DAY  0   No current facility-administered medications for this visit.      PHYSICAL EXAMINATION: ECOG PERFORMANCE STATUS: 0 - Asymptomatic Vitals:   08/17/17 1057  BP: 108/61  Pulse: 64  Temp: 98.1 F (36.7 C)   Filed Weights   08/17/17 1057  Weight: 179 lb 10.8 oz (81.5 kg)    Physical Exam  Constitutional: He is oriented to person, place, and time and well-developed, well-nourished, and in no distress. No distress.  HENT:  Head: Normocephalic and atraumatic.  Mouth/Throat: No oropharyngeal exudate.  Eyes: Pupils are equal, round, and reactive to light. EOM are normal.  Mild jaudice  Neck: Normal range of motion. Neck supple.  Cardiovascular: Normal rate, regular rhythm and normal heart sounds.    No murmur heard. Pulmonary/Chest: Effort normal. No respiratory distress.  Abdominal: Soft. Bowel sounds are normal. He exhibits no distension. There is no tenderness.  Musculoskeletal: Normal range of motion. He exhibits no edema.  Neurological: He is alert and oriented to person, place, and time. No cranial nerve deficit.  Skin: Skin is warm and dry. No erythema.  Psychiatric: Affect and judgment normal.     LABORATORY DATA:  I have reviewed the data as listed Lab Results  Component Value Date   WBC 5.8 08/16/2017   HGB 17.1 08/16/2017   HCT 48.1 08/16/2017   MCV 88.8 08/16/2017   PLT 242 08/16/2017   Recent Labs    07/06/17 1325 07/13/17 0833 07/20/17 1103 08/16/17 1103  NA  --  140 138 137  K  --  4.0 3.9 3.8  CL  --  103 104 103  CO2  --  22 25 25   GLUCOSE  --  87 93 94  BUN  --  11 10 13   CREATININE  --  0.93 0.77 0.82  CALCIUM  --  9.9 9.8 9.4  GFRNONAA  --  115 >60 >60  GFRAA  --  133 >60 >60  PROT 8.1 7.5 8.6* 8.2*  ALBUMIN 5.0 5.2 5.3* 5.1*  AST 19 16 22 24   ALT 16* 15 18 24   ALKPHOS 77 86 73 75  BILITOT 3.6* 4.2* 3.7* 3.2*  BILIDIR 0.2 0.24 0.2  --   IBILI 3.4* 3.96*  --   --        ASSESSMENT & PLAN:  1. Vitamin B12 deficiency   2. Indirect hyperbilirubinemia   Continue parental B12 IM weely injections, finish total 4 weeks. Repeat B12 level normal today.  Will check intrinsic antibody and anti parietal antibody. If positive, he will continue monthly B12. Injection. If negative, he can take oral supplements.   # Persistent hyperbilirubinemia, despite normalized B12 level.  Smear showed no schistocytes, will discuss with Pathology if any other RBC morphology abnormality, such as spherocytosis.   Check  UGT1A1 test to see if Sullivan LoneGilbert syndrome.   All questions were answered. The patient knows to call the clinic with any problems questions or concerns.  Return of visit: 2 months.   Rickard PatienceZhou Seamus Warehime, MD, PhD Hematology Oncology [facility]  at [facility] Pager- [phone] 08/17/2017

## 2017-08-17 NOTE — Patient Instructions (Signed)
Cyanocobalamin, Vitamin B12 injection What is this medicine? CYANOCOBALAMIN (sye an oh koe BAL a min) is a man made form of vitamin B12. Vitamin B12 is used in the growth of healthy blood cells, nerve cells, and proteins in the body. It also helps with the metabolism of fats and carbohydrates. This medicine is used to treat people who can not absorb vitamin B12. This medicine may be used for other purposes; ask your health care provider or pharmacist if you have questions. COMMON BRAND NAME(S): B-12 Compliance Kit, B-12 Injection Kit, Cyomin, LA-12, Nutri-Twelve, Physicians EZ Use B-12, Primabalt What should I tell my health care provider before I take this medicine? They need to know if you have any of these conditions: -kidney disease -Leber's disease -megaloblastic anemia -an unusual or allergic reaction to cyanocobalamin, cobalt, other medicines, foods, dyes, or preservatives -pregnant or trying to get pregnant -breast-feeding How should I use this medicine? This medicine is injected into a muscle or deeply under the skin. It is usually given by a health care professional in a clinic or doctor's office. However, your doctor may teach you how to inject yourself. Follow all instructions. Talk to your pediatrician regarding the use of this medicine in children. Special care may be needed. Overdosage: If you think you have taken too much of this medicine contact a poison control center or emergency room at once. NOTE: This medicine is only for you. Do not share this medicine with others. What if I miss a dose? If you are given your dose at a clinic or doctor's office, call to reschedule your appointment. If you give your own injections and you miss a dose, take it as soon as you can. If it is almost time for your next dose, take only that dose. Do not take double or extra doses. What may interact with this medicine? -colchicine -heavy alcohol intake This list may not describe all possible  interactions. Give your health care provider a list of all the medicines, herbs, non-prescription drugs, or dietary supplements you use. Also tell them if you smoke, drink alcohol, or use illegal drugs. Some items may interact with your medicine. What should I watch for while using this medicine? Visit your doctor or health care professional regularly. You may need blood work done while you are taking this medicine. You may need to follow a special diet. Talk to your doctor. Limit your alcohol intake and avoid smoking to get the best benefit. What side effects may I notice from receiving this medicine? Side effects that you should report to your doctor or health care professional as soon as possible: -allergic reactions like skin rash, itching or hives, swelling of the face, lips, or tongue -blue tint to skin -chest tightness, pain -difficulty breathing, wheezing -dizziness -red, swollen painful area on the leg Side effects that usually do not require medical attention (report to your doctor or health care professional if they continue or are bothersome): -diarrhea -headache This list may not describe all possible side effects. Call your doctor for medical advice about side effects. You may report side effects to FDA at 1-800-FDA-1088. Where should I keep my medicine? Keep out of the reach of children. Store at room temperature between 15 and 30 degrees C (59 and 85 degrees F). Protect from light. Throw away any unused medicine after the expiration date. NOTE: This sheet is a summary. It may not cover all possible information. If you have questions about this medicine, talk to your doctor, pharmacist, or   health care provider.  2018 Elsevier/Gold Standard (2007-07-30 22:10:20)  

## 2017-08-21 ENCOUNTER — Encounter: Payer: Self-pay | Admitting: Surgery

## 2017-08-21 ENCOUNTER — Ambulatory Visit (INDEPENDENT_AMBULATORY_CARE_PROVIDER_SITE_OTHER): Payer: BC Managed Care – PPO | Admitting: Surgery

## 2017-08-21 DIAGNOSIS — L0501 Pilonidal cyst with abscess: Secondary | ICD-10-CM

## 2017-08-21 NOTE — Patient Instructions (Signed)
Please give us a call in case you have any questions or concerns.  

## 2017-08-21 NOTE — Progress Notes (Signed)
  08/21/2017  History of Present Illness: Benjamin Green is a 22 y.o. male who presents for evaluation of his pilonidal cyst excision wound.  He has been having since surgery some intermittent issues with the wound opening and healing.  He reports that his wound had healed well and over the weekend felt that the inferior portion of the wound opened and had bloody drainage.  It has improved since but he wanted to be evaluated.  Denies any fevers, chills, worsening pain, nausea, or vomiting.  Past Medical History: Past Medical History:  Diagnosis Date  . Complication of anesthesia   . Headache    FREQUENT HA'S  . Pilonidal cyst with abscess   . Vitamin B12 deficiency 07/20/2017     Past Surgical History: Past Surgical History:  Procedure Laterality Date  . MIDDLE EAR SURGERY    . PILONIDAL CYST EXCISION N/A 04/21/2017   Procedure: CYST EXCISION PILONIDAL EXTENSIVE;  Surgeon: Henrene DodgePiscoya, Nina Hoar, MD;  Location: [facility] ORS;  Service: General;  Laterality: N/A;  . TONSILLECTOMY     Allergies: No Known Allergies  Review of Systems: Review of Systems  Constitutional: Negative for chills and fever.  Gastrointestinal: Negative for nausea and vomiting.  Skin: Negative for rash.    Physical Exam There were no vitals taken for this visit. CONSTITUTIONAL: No acute distress Skin:  Pilonidal cyst excision wound has healed well, with exception of one punctate wound at the inferior pole of the scar.  There is minimal to no drainage despite of manual pressure.  There is no erythema, induration, or fluctuance.    Assessment and Plan: This is a 22 y.o. male who presents for follow up of pilonidal cyst excision.  Discussed with the patient that the wound looks good with only that punctate wound.  Discussed with patient that no antibiotics are needed an no procedures are needed at the time.  Did clip the patient's hair around the incision to keep the area clean.  He can do this at home as well.  He can  follow up with us on an as needed basis.  Face-to-face time spent with the patient and care providers was 10 minutes, with more than 50% of the time spent counseling, educating, and coordinating care of the patient.     Benjamin IllJose Luis Arlethia Basso, MD [facility]

## 2017-08-23 ENCOUNTER — Ambulatory Visit: Payer: BC Managed Care – PPO | Admitting: Gastroenterology

## 2017-08-24 ENCOUNTER — Inpatient Hospital Stay: Payer: BC Managed Care – PPO

## 2017-08-25 ENCOUNTER — Inpatient Hospital Stay: Payer: BC Managed Care – PPO

## 2017-08-25 DIAGNOSIS — E538 Deficiency of other specified B group vitamins: Secondary | ICD-10-CM

## 2017-08-25 MED ORDER — CYANOCOBALAMIN 1000 MCG/ML IJ SOLN
1000.0000 ug | Freq: Once | INTRAMUSCULAR | Status: AC
  Administered 2017-08-25: 1000 ug via INTRAMUSCULAR

## 2017-09-10 ENCOUNTER — Encounter: Payer: Self-pay | Admitting: Surgery

## 2017-10-12 ENCOUNTER — Inpatient Hospital Stay: Payer: BC Managed Care – PPO | Attending: Oncology | Admitting: Oncology

## 2017-10-12 DIAGNOSIS — E538 Deficiency of other specified B group vitamins: Secondary | ICD-10-CM | POA: Insufficient documentation

## 2017-10-12 LAB — CBC WITH DIFFERENTIAL/PLATELET
BASOS ABS: 0.1 10*3/uL (ref 0–0.1)
Basophils Relative: 1 %
Eosinophils Absolute: 0 10*3/uL (ref 0–0.7)
Eosinophils Relative: 0 %
HCT: 47 % (ref 40.0–52.0)
Hemoglobin: 16.9 g/dL (ref 13.0–18.0)
LYMPHS PCT: 27 %
Lymphs Abs: 1.6 10*3/uL (ref 1.0–3.6)
MCH: 31.3 pg (ref 26.0–34.0)
MCHC: 35.9 g/dL (ref 32.0–36.0)
MCV: 87 fL (ref 80.0–100.0)
Monocytes Absolute: 0.6 10*3/uL (ref 0.2–1.0)
Monocytes Relative: 9 %
Neutro Abs: 3.7 10*3/uL (ref 1.4–6.5)
Neutrophils Relative %: 63 %
Platelets: 247 10*3/uL (ref 150–440)
RBC: 5.4 MIL/uL (ref 4.40–5.90)
RDW: 12.9 % (ref 11.5–14.5)
WBC: 6 10*3/uL (ref 3.8–10.6)

## 2017-10-12 LAB — VITAMIN B12: VITAMIN B 12: 178 pg/mL — AB (ref 180–914)

## 2017-10-16 LAB — COMP PANEL: LEUKEMIA/LYMPHOMA

## 2017-10-19 ENCOUNTER — Encounter: Payer: Self-pay | Admitting: Oncology

## 2017-10-19 ENCOUNTER — Inpatient Hospital Stay (HOSPITAL_BASED_OUTPATIENT_CLINIC_OR_DEPARTMENT_OTHER): Payer: BC Managed Care – PPO | Admitting: Oncology

## 2017-10-19 ENCOUNTER — Inpatient Hospital Stay: Payer: BC Managed Care – PPO

## 2017-10-19 ENCOUNTER — Other Ambulatory Visit: Payer: Self-pay

## 2017-10-19 VITALS — BP 135/81 | HR 73 | Temp 97.4°F | Resp 18 | Wt 179.7 lb

## 2017-10-19 DIAGNOSIS — E538 Deficiency of other specified B group vitamins: Secondary | ICD-10-CM

## 2017-10-19 LAB — HEPATIC FUNCTION PANEL
ALBUMIN: 4.9 g/dL (ref 3.5–5.0)
ALK PHOS: 77 U/L (ref 38–126)
ALT: 20 U/L (ref 17–63)
AST: 26 U/L (ref 15–41)
Bilirubin, Direct: 0.2 mg/dL (ref 0.1–0.5)
Indirect Bilirubin: 3.1 mg/dL — ABNORMAL HIGH (ref 0.3–0.9)
TOTAL PROTEIN: 8.1 g/dL (ref 6.5–8.1)
Total Bilirubin: 3.3 mg/dL — ABNORMAL HIGH (ref 0.3–1.2)

## 2017-10-19 MED ORDER — CYANOCOBALAMIN 1000 MCG/ML IJ SOLN
1000.0000 ug | Freq: Once | INTRAMUSCULAR | Status: AC
  Administered 2017-10-19: 1000 ug via INTRAMUSCULAR

## 2017-10-19 NOTE — Progress Notes (Signed)
Hematology/Oncology Follow up note Benjamin Green:([phone] [phone]) [phone]   Patient Care Team: Dione Housekeeper, MD as PCP - General (Family Medicine)  REFERRING PROVIDER: Corrinne Green  REASON FOR VISIT Follow up for treatment of : Vitamin B12 deficiency and hyperbilirubinemia.    HISTORY OF PRESENTING ILLNESS:  Benjamin Green is a  22 y.o.  male with PMH listed below who was referred to me for evaluation of hyperbilirubinemia, possible hemolysis.  Patient had pilonidal cyst excision on 12/21.  Patient was noted to have jaundice.  Liver function tests showed isolated elevated bilirubin of 7.3 with normal AST and ALT.  Coags are normal.  Liver function test was repeated 9 days later which again showed bilirubinemia. Hemoglobin 16, US abdomen showed splenomegaly with no liver abnormality. No older labs to compare.   Patient reports "eyes turn yellow" when dieting and losing weight. He has intentionally lost 30 pounds recently. Denies any herbal medication, recreational drug except marijuana. He was on antibiotics in January 2019. No travel outside of [place], no smoking, social drinking .   INTERVAL HISTORY Benjamin Green is a 22 y.o. male presents for follow-up for vitamin B12 deficiency and hyperbilirubinemia. He has B12 repeated in the level has decreased again.  Clinically he reports feeling well.  He does not feel any difference than his baseline.  No new complaints.   Review of Systems  Constitutional: Negative for chills, diaphoresis, fever, malaise/fatigue and weight loss.  HENT: Negative for congestion, ear discharge, ear pain, hearing loss, nosebleeds, sinus pain and sore throat.   Eyes: Negative for double vision, photophobia, pain, discharge and redness.  Respiratory: Negative for cough, hemoptysis, sputum production, shortness of breath and wheezing.   Cardiovascular: Negative for chest pain, palpitations, orthopnea, claudication  and leg swelling.  Gastrointestinal: Negative for abdominal pain, blood in stool, constipation, diarrhea, heartburn, melena, nausea and vomiting.  Genitourinary: Negative for dysuria, flank pain, frequency, hematuria and urgency.  Musculoskeletal: Negative for back pain, myalgias and neck pain.  Skin: Negative for itching and rash.  Neurological: Negative for dizziness, tingling, tremors, sensory change, focal weakness, weakness and headaches.  Endo/Heme/Allergies: Negative for environmental allergies. Does not bruise/bleed easily.  Psychiatric/Behavioral: Negative for depression, hallucinations and suicidal ideas. The patient is not nervous/anxious.     MEDICAL HISTORY:  Past Medical History:  Diagnosis Date  . Complication of anesthesia   . Headache    FREQUENT HA'S  . Pilonidal cyst with abscess   . Vitamin B12 deficiency 07/20/2017    SURGICAL HISTORY: Past Surgical History:  Procedure Laterality Date  . MIDDLE EAR SURGERY    . PILONIDAL CYST EXCISION N/A 04/21/2017   Procedure: CYST EXCISION PILONIDAL EXTENSIVE;  Surgeon: Henrene Dodge, MD;  Location: [facility] ORS;  Service: General;  Laterality: N/A;  . TONSILLECTOMY      SOCIAL HISTORY: Social History   Socioeconomic History  . Marital status: Single    Spouse name: Not on file  . Number of children: Not on file  . Years of education: Not on file  . Highest education level: Not on file  Occupational History  . Not on file  Social Needs  . Financial resource strain: Not on file  . Food insecurity:    Worry: Not on file    Inability: Not on file  . Transportation needs:    Medical: Not on file    Non-medical: Not on file  Tobacco Use  . Smoking status: Former Smoker    Packs/day:  0.50    Years: 6.00    Pack years: 3.00    Types: Cigarettes    Last attempt to quit: 06/14/2016    Years since quitting: 1.3  . Smokeless tobacco: Never Used  Substance and Sexual Activity  . Alcohol use: Yes    Comment: occasional  - once a week   . Drug use: Yes    Types: Marijuana    Comment: MOSTLY 2 TIMES WEEKLY OR MAY SMOKE EVERYDAY IF HE HAS THE MONEY FOR IT  . Sexual activity: Yes  Lifestyle  . Physical activity:    Days per week: Not on file    Minutes per session: Not on file  . Stress: Not on file  Relationships  . Social connections:    Talks on phone: Not on file    Gets together: Not on file    Attends religious service: Not on file    Active member of club or organization: Not on file    Attends meetings of clubs or organizations: Not on file    Relationship status: Not on file  . Intimate partner violence:    Fear of current or ex partner: Not on file    Emotionally abused: Not on file    Physically abused: Not on file    Forced sexual activity: Not on file  Other Topics Concern  . Not on file  Social History Narrative  . Not on file    FAMILY HISTORY: Family History  Problem Relation Age of Onset  . Healthy Mother   . Breast cancer Maternal Aunt     ALLERGIES:  has No Known Allergies.  MEDICATIONS:  Current Outpatient Medications  Medication Sig Dispense Refill  . CORDRAN 0.05 % CREA Apply 1 application topically daily as needed (dermatitis).     Marland Kitchen ibuprofen (ADVIL,MOTRIN) 600 MG tablet Take 1 tablet (600 mg total) by mouth every 8 (eight) hours as needed for headache, mild pain or moderate pain. 30 tablet 0  . RETIN-A MICRO PUMP 0.08 % GEL Apply 1 application topically at bedtime.     . Sulfacetamide Sodium-Sulfur 10-5 % EMUL WASH FACE TWICE DAILY  5   No current facility-administered medications for this visit.      PHYSICAL EXAMINATION: ECOG PERFORMANCE STATUS: 0 - Asymptomatic Vitals:   10/19/17 1046  BP: 135/81  Pulse: 73  Resp: 18  Temp: (!) 97.4 F (36.3 C)   Filed Weights   10/19/17 1046  Weight: 179 lb 10.8 oz (81.5 kg)    Physical Exam  Constitutional: He is oriented to person, place, and time and well-developed, well-nourished, and in no distress. No  distress.  HENT:  Head: Normocephalic and atraumatic.  Mouth/Throat: No oropharyngeal exudate.  Eyes: Pupils are equal, round, and reactive to light. EOM are normal.  Mild jaudice  Neck: Normal range of motion. Neck supple.  Cardiovascular: Normal rate, regular rhythm and normal heart sounds.  No murmur heard. Pulmonary/Chest: Effort normal. No respiratory distress.  Abdominal: Soft. Bowel sounds are normal. He exhibits no distension. There is no tenderness.  Musculoskeletal: Normal range of motion. He exhibits no edema.  Neurological: He is alert and oriented to person, place, and time. No cranial nerve deficit.  Skin: Skin is warm and dry. No erythema.  Psychiatric: Affect and judgment normal.     LABORATORY DATA:  I have reviewed the data as listed Lab Results  Component Value Date   WBC 6.0 10/12/2017   HGB 16.9 10/12/2017   HCT 47.0  10/12/2017   MCV 87.0 10/12/2017   PLT 247 10/12/2017   Recent Labs    07/06/17 1325 07/13/17 0833 07/20/17 1103 08/16/17 1103  NA  --  140 138 137  K  --  4.0 3.9 3.8  CL  --  103 104 103  CO2  --  22 25 25   GLUCOSE  --  87 93 94  BUN  --  11 10 13   CREATININE  --  0.93 0.77 0.82  CALCIUM  --  9.9 9.8 9.4  GFRNONAA  --  115 >60 >60  GFRAA  --  133 >60 >60  PROT 8.1 7.5 8.6* 8.2*  ALBUMIN 5.0 5.2 5.3* 5.1*  AST 19 16 22 24   ALT 16* 15 18 24   ALKPHOS 77 86 73 75  BILITOT 3.6* 4.2* 3.7* 3.2*  BILIDIR 0.2 0.24 0.2  --   IBILI 3.4* 3.96*  --   --        ASSESSMENT & PLAN:  1. Vitamin B12 deficiency   2. Indirect hyperbilirubinemia   3. Hyperbilirubinemia    # B12 deficiency, continue weekly B12 x4 followed by monthly B12 injection. Negative intrinsic antibody and antiparietal antibody.  # Persistent hyperbilirubinemia, indirect bilirubin,  check UGT1A1 mutation test to see if he has Gilbert's syndrome. Repeat LFT today.   Orders Placed This Encounter  Procedures  . Miscellaneous [facility] test (send-out)    Standing  Status:   Future    Number of Occurrences:   1    Standing Expiration Date:   10/19/2018    Order Specific Question:   Test name / description:    Answer:   UGT1A mutation test  . Hepatic function panel    Standing Status:   Future    Number of Occurrences:   1    Standing Expiration Date:   10/19/2018  . CBC with Differential/Platelet    Standing Status:   Future    Standing Expiration Date:   10/20/2018  . Comprehensive metabolic panel    Standing Status:   Future    Standing Expiration Date:   10/20/2018  . Vitamin B12    Standing Status:   Future    Standing Expiration Date:   10/20/2018   All questions were answered. The patient knows to call the clinic with any problems questions or concerns.  Return of visit: 3 months with repeat labs.  Rickard PatienceZhou Merian Wroe, MD, PhD Hematology Oncology [facility] Pager- [phone] 10/19/2017

## 2017-10-19 NOTE — Progress Notes (Signed)
Patient here today for follow up. No concerns voiced.  °

## 2017-10-20 ENCOUNTER — Encounter: Payer: Self-pay | Admitting: Oncology

## 2017-10-24 LAB — MISC LABCORP TEST (SEND OUT): Labcorp test code: 511200

## 2017-10-26 ENCOUNTER — Inpatient Hospital Stay: Payer: BC Managed Care – PPO

## 2017-10-26 VITALS — BP 129/77 | HR 69 | Temp 97.0°F | Resp 18

## 2017-10-26 DIAGNOSIS — E538 Deficiency of other specified B group vitamins: Secondary | ICD-10-CM

## 2017-10-26 MED ORDER — CYANOCOBALAMIN 1000 MCG/ML IJ SOLN
1000.0000 ug | Freq: Once | INTRAMUSCULAR | Status: AC
  Administered 2017-10-26: 1000 ug via INTRAMUSCULAR

## 2017-11-01 NOTE — Progress Notes (Signed)
error 

## 2017-11-03 ENCOUNTER — Inpatient Hospital Stay: Payer: BC Managed Care – PPO | Attending: Oncology

## 2017-11-03 VITALS — Resp 18

## 2017-11-03 DIAGNOSIS — E538 Deficiency of other specified B group vitamins: Secondary | ICD-10-CM | POA: Insufficient documentation

## 2017-11-03 MED ORDER — CYANOCOBALAMIN 1000 MCG/ML IJ SOLN
1000.0000 ug | Freq: Once | INTRAMUSCULAR | Status: AC
  Administered 2017-11-03: 1000 ug via INTRAMUSCULAR

## 2017-11-03 NOTE — Patient Instructions (Signed)
Cyanocobalamin, Vitamin B12 injection What is this medicine? CYANOCOBALAMIN (sye an oh koe BAL a min) is a man made form of vitamin B12. Vitamin B12 is used in the growth of healthy blood cells, nerve cells, and proteins in the body. It also helps with the metabolism of fats and carbohydrates. This medicine is used to treat people who can not absorb vitamin B12. This medicine may be used for other purposes; ask your health care provider or pharmacist if you have questions. COMMON BRAND NAME(S): B-12 Compliance Kit, B-12 Injection Kit, Cyomin, LA-12, Nutri-Twelve, Physicians EZ Use B-12, Primabalt What should I tell my health care provider before I take this medicine? They need to know if you have any of these conditions: -kidney disease -Leber's disease -megaloblastic anemia -an unusual or allergic reaction to cyanocobalamin, cobalt, other medicines, foods, dyes, or preservatives -pregnant or trying to get pregnant -breast-feeding How should I use this medicine? This medicine is injected into a muscle or deeply under the skin. It is usually given by a health care professional in a clinic or doctor's office. However, your doctor may teach you how to inject yourself. Follow all instructions. Talk to your pediatrician regarding the use of this medicine in children. Special care may be needed. Overdosage: If you think you have taken too much of this medicine contact a poison control center or emergency room at once. NOTE: This medicine is only for you. Do not share this medicine with others. What if I miss a dose? If you are given your dose at a clinic or doctor's office, call to reschedule your appointment. If you give your own injections and you miss a dose, take it as soon as you can. If it is almost time for your next dose, take only that dose. Do not take double or extra doses. What may interact with this medicine? -colchicine -heavy alcohol intake This list may not describe all possible  interactions. Give your health care provider a list of all the medicines, herbs, non-prescription drugs, or dietary supplements you use. Also tell them if you smoke, drink alcohol, or use illegal drugs. Some items may interact with your medicine. What should I watch for while using this medicine? Visit your doctor or health care professional regularly. You may need blood work done while you are taking this medicine. You may need to follow a special diet. Talk to your doctor. Limit your alcohol intake and avoid smoking to get the best benefit. What side effects may I notice from receiving this medicine? Side effects that you should report to your doctor or health care professional as soon as possible: -allergic reactions like skin rash, itching or hives, swelling of the face, lips, or tongue -blue tint to skin -chest tightness, pain -difficulty breathing, wheezing -dizziness -red, swollen painful area on the leg Side effects that usually do not require medical attention (report to your doctor or health care professional if they continue or are bothersome): -diarrhea -headache This list may not describe all possible side effects. Call your doctor for medical advice about side effects. You may report side effects to FDA at 1-800-FDA-1088. Where should I keep my medicine? Keep out of the reach of children. Store at room temperature between 15 and 30 degrees C (59 and 85 degrees F). Protect from light. Throw away any unused medicine after the expiration date. NOTE: This sheet is a summary. It may not cover all possible information. If you have questions about this medicine, talk to your doctor, pharmacist, or   health care provider.  2018 Elsevier/Gold Standard (2007-07-30 22:10:20)  

## 2017-11-09 ENCOUNTER — Inpatient Hospital Stay: Payer: BC Managed Care – PPO

## 2017-11-09 VITALS — BP 120/64 | HR 72 | Temp 97.3°F | Resp 18

## 2017-11-09 DIAGNOSIS — E538 Deficiency of other specified B group vitamins: Secondary | ICD-10-CM | POA: Diagnosis not present

## 2017-11-09 MED ORDER — CYANOCOBALAMIN 1000 MCG/ML IJ SOLN
1000.0000 ug | Freq: Once | INTRAMUSCULAR | Status: AC
  Administered 2017-11-09: 1000 ug via INTRAMUSCULAR

## 2017-12-07 ENCOUNTER — Ambulatory Visit: Payer: BC Managed Care – PPO

## 2017-12-11 ENCOUNTER — Inpatient Hospital Stay: Payer: BC Managed Care – PPO | Attending: Oncology

## 2017-12-11 VITALS — BP 142/78 | HR 84 | Resp 18

## 2017-12-11 DIAGNOSIS — E538 Deficiency of other specified B group vitamins: Secondary | ICD-10-CM | POA: Insufficient documentation

## 2017-12-11 MED ORDER — CYANOCOBALAMIN 1000 MCG/ML IJ SOLN
1000.0000 ug | Freq: Once | INTRAMUSCULAR | Status: AC
Start: 2017-12-11 — End: 2017-12-11
  Administered 2017-12-11: 1000 ug via INTRAMUSCULAR

## 2017-12-11 NOTE — Patient Instructions (Signed)
Cyanocobalamin, Vitamin B12 injection What is this medicine? CYANOCOBALAMIN (sye an oh koe BAL a min) is a man made form of vitamin B12. Vitamin B12 is used in the growth of healthy blood cells, nerve cells, and proteins in the body. It also helps with the metabolism of fats and carbohydrates. This medicine is used to treat people who can not absorb vitamin B12. This medicine may be used for other purposes; ask your health care provider or pharmacist if you have questions. COMMON BRAND NAME(S): B-12 Compliance Kit, B-12 Injection Kit, Cyomin, LA-12, Nutri-Twelve, Physicians EZ Use B-12, Primabalt What should I tell my health care provider before I take this medicine? They need to know if you have any of these conditions: -kidney disease -Leber's disease -megaloblastic anemia -an unusual or allergic reaction to cyanocobalamin, cobalt, other medicines, foods, dyes, or preservatives -pregnant or trying to get pregnant -breast-feeding How should I use this medicine? This medicine is injected into a muscle or deeply under the skin. It is usually given by a health care professional in a clinic or doctor's office. However, your doctor may teach you how to inject yourself. Follow all instructions. Talk to your pediatrician regarding the use of this medicine in children. Special care may be needed. Overdosage: If you think you have taken too much of this medicine contact a poison control center or emergency room at once. NOTE: This medicine is only for you. Do not share this medicine with others. What if I miss a dose? If you are given your dose at a clinic or doctor's office, call to reschedule your appointment. If you give your own injections and you miss a dose, take it as soon as you can. If it is almost time for your next dose, take only that dose. Do not take double or extra doses. What may interact with this medicine? -colchicine -heavy alcohol intake This list may not describe all possible  interactions. Give your health care provider a list of all the medicines, herbs, non-prescription drugs, or dietary supplements you use. Also tell them if you smoke, drink alcohol, or use illegal drugs. Some items may interact with your medicine. What should I watch for while using this medicine? Visit your doctor or health care professional regularly. You may need blood work done while you are taking this medicine. You may need to follow a special diet. Talk to your doctor. Limit your alcohol intake and avoid smoking to get the best benefit. What side effects may I notice from receiving this medicine? Side effects that you should report to your doctor or health care professional as soon as possible: -allergic reactions like skin rash, itching or hives, swelling of the face, lips, or tongue -blue tint to skin -chest tightness, pain -difficulty breathing, wheezing -dizziness -red, swollen painful area on the leg Side effects that usually do not require medical attention (report to your doctor or health care professional if they continue or are bothersome): -diarrhea -headache This list may not describe all possible side effects. Call your doctor for medical advice about side effects. You may report side effects to FDA at 1-800-FDA-1088. Where should I keep my medicine? Keep out of the reach of children. Store at room temperature between 15 and 30 degrees C (59 and 85 degrees F). Protect from light. Throw away any unused medicine after the expiration date. NOTE: This sheet is a summary. It may not cover all possible information. If you have questions about this medicine, talk to your doctor, pharmacist, or  health care provider.  2018 Elsevier/Gold Standard (2007-07-30 22:10:20)  

## 2018-01-04 ENCOUNTER — Inpatient Hospital Stay: Payer: BC Managed Care – PPO | Attending: Oncology

## 2018-01-04 DIAGNOSIS — E538 Deficiency of other specified B group vitamins: Secondary | ICD-10-CM | POA: Diagnosis not present

## 2018-01-04 DIAGNOSIS — Z87891 Personal history of nicotine dependence: Secondary | ICD-10-CM | POA: Diagnosis not present

## 2018-01-04 LAB — COMPREHENSIVE METABOLIC PANEL
ALBUMIN: 4.8 g/dL (ref 3.5–5.0)
ALT: 25 U/L (ref 0–44)
ANION GAP: 9 (ref 5–15)
AST: 23 U/L (ref 15–41)
Alkaline Phosphatase: 70 U/L (ref 38–126)
BILIRUBIN TOTAL: 4.4 mg/dL — AB (ref 0.3–1.2)
BUN: 9 mg/dL (ref 6–20)
CO2: 26 mmol/L (ref 22–32)
Calcium: 9.4 mg/dL (ref 8.9–10.3)
Chloride: 104 mmol/L (ref 98–111)
Creatinine, Ser: 0.86 mg/dL (ref 0.61–1.24)
GFR calc Af Amer: >60 mL/min (ref >60)
GFR calc non Af Amer: >60 mL/min (ref >60)
Glucose, Bld: 91 mg/dL (ref 70–99)
POTASSIUM: 3.9 mmol/L (ref 3.5–5.1)
Sodium: 139 mmol/L (ref 135–145)
TOTAL PROTEIN: 7.9 g/dL (ref 6.5–8.1)

## 2018-01-04 LAB — CBC WITH DIFFERENTIAL/PLATELET
BASOS ABS: 0.1 10*3/uL (ref 0–0.1)
Basophils Relative: 1 %
EOS PCT: 1 %
Eosinophils Absolute: 0 10*3/uL (ref 0–0.7)
HEMATOCRIT: 46.6 % (ref 40.0–52.0)
Hemoglobin: 16.5 g/dL (ref 13.0–18.0)
LYMPHS PCT: 29 %
Lymphs Abs: 1.6 10*3/uL (ref 1.0–3.6)
MCH: 31.3 pg (ref 26.0–34.0)
MCHC: 35.3 g/dL (ref 32.0–36.0)
MCV: 88.5 fL (ref 80.0–100.0)
MONOS PCT: 9 %
Monocytes Absolute: 0.5 10*3/uL (ref 0.2–1.0)
Neutro Abs: 3.4 10*3/uL (ref 1.4–6.5)
Neutrophils Relative %: 60 %
PLATELETS: 208 10*3/uL (ref 150–440)
RBC: 5.27 MIL/uL (ref 4.40–5.90)
RDW: 13.4 % (ref 11.5–14.5)
WBC: 5.7 10*3/uL (ref 3.8–10.6)

## 2018-01-04 LAB — VITAMIN B12: Vitamin B-12: 289 pg/mL (ref 180–914)

## 2018-01-11 ENCOUNTER — Inpatient Hospital Stay: Payer: BC Managed Care – PPO

## 2018-01-11 ENCOUNTER — Inpatient Hospital Stay (HOSPITAL_BASED_OUTPATIENT_CLINIC_OR_DEPARTMENT_OTHER): Payer: BC Managed Care – PPO | Admitting: Oncology

## 2018-01-11 ENCOUNTER — Other Ambulatory Visit: Payer: Self-pay

## 2018-01-11 ENCOUNTER — Encounter: Payer: Self-pay | Admitting: Oncology

## 2018-01-11 VITALS — BP 130/74 | HR 69 | Temp 96.7°F | Resp 18 | Wt 182.1 lb

## 2018-01-11 DIAGNOSIS — E538 Deficiency of other specified B group vitamins: Secondary | ICD-10-CM

## 2018-01-11 MED ORDER — CYANOCOBALAMIN 1000 MCG/ML IJ SOLN
1000.0000 ug | Freq: Once | INTRAMUSCULAR | Status: AC
  Administered 2018-01-11: 1000 ug via INTRAMUSCULAR

## 2018-01-11 NOTE — Progress Notes (Signed)
Hematology/Oncology Follow up note [facility] Telephone:(336) [phone] Fax:(336) [phone]   Patient Care Team: Dione Housekeeper, MD as PCP - General (Family Medicine)  REFERRING PROVIDER: Corrinne Eagle  REASON FOR VISIT Follow up for treatment of : Vitamin B12 deficiency and hyperbilirubinemia.    HISTORY OF PRESENTING ILLNESS:  Benjamin Green is a  22 y.o.  male with PMH listed below who was referred to me for evaluation of hyperbilirubinemia, possible hemolysis.  Patient had pilonidal cyst excision on 12/21.  Patient was noted to have jaundice.  Liver function tests showed isolated elevated bilirubin of 7.3 with normal AST and ALT.  Coags are normal.  Liver function test was repeated 9 days later which again showed bilirubinemia. Hemoglobin 16, US abdomen showed splenomegaly with no liver abnormality. No older labs to compare.   Patient reports "eyes turn yellow" when dieting and losing weight. He has intentionally lost 30 pounds recently. Denies any herbal medication, recreational drug except marijuana. He was on antibiotics in January 2019. No travel outside of [place], no smoking, social drinking .   INTERVAL HISTORY Benjamin Green is a 22 y.o. male presents for follow-up for vitamin B12 deficiency and hyperbilirubinemia. He has been getting monthly parental B12 injections.  He reports that he only eats 1-2 meal per day, mostly vegetable, not much meat.  He feels well, no numbness tingling, fatigue, weight loss.   He reports his eye has turn yellowish again, had some stress in life recently.   Review of Systems  Constitutional: Negative for chills, diaphoresis, fever, malaise/fatigue and weight loss.  HENT: Negative for congestion, ear discharge, ear pain, hearing loss, nosebleeds, sinus pain and sore throat.   Eyes: Negative for double vision, photophobia, pain, discharge and redness.  Respiratory: Negative for cough, hemoptysis, sputum production,  shortness of breath and wheezing.   Cardiovascular: Negative for chest pain, palpitations, orthopnea, claudication and leg swelling.  Gastrointestinal: Negative for abdominal pain, blood in stool, constipation, diarrhea, heartburn, melena, nausea and vomiting.  Genitourinary: Negative for dysuria, flank pain, frequency, hematuria and urgency.  Musculoskeletal: Negative for back pain, myalgias and neck pain.  Skin: Negative for itching and rash.  Neurological: Negative for dizziness, tingling, tremors, sensory change, focal weakness, weakness and headaches.  Endo/Heme/Allergies: Negative for environmental allergies. Does not bruise/bleed easily.  Psychiatric/Behavioral: Negative for depression, hallucinations and suicidal ideas. The patient is not nervous/anxious.     MEDICAL HISTORY:  Past Medical History:  Diagnosis Date  . Complication of anesthesia   . Headache    FREQUENT HA'S  . Pilonidal cyst with abscess   . Vitamin B12 deficiency 07/20/2017    SURGICAL HISTORY: Past Surgical History:  Procedure Laterality Date  . MIDDLE EAR SURGERY    . PILONIDAL CYST EXCISION N/A 04/21/2017   Procedure: CYST EXCISION PILONIDAL EXTENSIVE;  Surgeon: Henrene Dodge, MD;  Location: [facility] ORS;  Service: General;  Laterality: N/A;  . TONSILLECTOMY      SOCIAL HISTORY: Social History   Socioeconomic History  . Marital status: Single    Spouse name: Not on file  . Number of children: Not on file  . Years of education: Not on file  . Highest education level: Not on file  Occupational History  . Not on file  Social Needs  . Financial resource strain: Not on file  . Food insecurity:    Worry: Not on file    Inability: Not on file  . Transportation needs:    Medical: Not on file  Non-medical: Not on file  Tobacco Use  . Smoking status: Former Smoker    Packs/day: 0.50    Years: 6.00    Pack years: 3.00    Types: Cigarettes    Last attempt to quit: 06/14/2016    Years since quitting:  1.5  . Smokeless tobacco: Never Used  Substance and Sexual Activity  . Alcohol use: Yes    Comment: occasional - once a week   . Drug use: Yes    Types: Marijuana    Comment: MOSTLY 2 TIMES WEEKLY OR MAY SMOKE EVERYDAY IF HE HAS THE MONEY FOR IT  . Sexual activity: Yes  Lifestyle  . Physical activity:    Days per week: Not on file    Minutes per session: Not on file  . Stress: Not on file  Relationships  . Social connections:    Talks on phone: Not on file    Gets together: Not on file    Attends religious service: Not on file    Active member of club or organization: Not on file    Attends meetings of clubs or organizations: Not on file    Relationship status: Not on file  . Intimate partner violence:    Fear of current or ex partner: Not on file    Emotionally abused: Not on file    Physically abused: Not on file    Forced sexual activity: Not on file  Other Topics Concern  . Not on file  Social History Narrative  . Not on file    FAMILY HISTORY: Family History  Problem Relation Age of Onset  . Healthy Mother   . Breast cancer Maternal Aunt     ALLERGIES:  has No Known Allergies.  MEDICATIONS:  Current Outpatient Medications  Medication Sig Dispense Refill  . ibuprofen (ADVIL,MOTRIN) 600 MG tablet Take 1 tablet (600 mg total) by mouth every 8 (eight) hours as needed for headache, mild pain or moderate pain. 30 tablet 0  . Multiple Vitamin (MULTIVITAMIN) tablet Take 1 tablet by mouth daily.    Marland Kitchen RETIN-A MICRO PUMP 0.08 % GEL Apply 1 application topically at bedtime.     . Sulfacetamide Sodium-Sulfur 10-5 % EMUL WASH FACE TWICE DAILY  5   No current facility-administered medications for this visit.      PHYSICAL EXAMINATION: ECOG PERFORMANCE STATUS: 0 - Asymptomatic Vitals:   01/11/18 1045  BP: 130/74  Pulse: 69  Resp: 18  Temp: (!) 96.7 F (35.9 C)   Filed Weights   01/11/18 1045  Weight: 182 lb 1.6 oz (82.6 kg)    Physical Exam  Constitutional:  He is oriented to person, place, and time and well-developed, well-nourished, and in no distress. No distress.  HENT:  Head: Normocephalic and atraumatic.  Nose: Nose normal.  Mouth/Throat: Oropharynx is clear and moist. No oropharyngeal exudate.  Eyes: Pupils are equal, round, and reactive to light. EOM are normal. Left eye exhibits no discharge. No scleral icterus.  Mild jaudice  Neck: Normal range of motion. Neck supple.  Cardiovascular: Normal rate, regular rhythm and normal heart sounds.  No murmur heard. Pulmonary/Chest: Effort normal. No respiratory distress. He has no rales. He exhibits no tenderness.  Abdominal: Soft. Bowel sounds are normal. He exhibits no distension and no mass. There is no tenderness.  Musculoskeletal: Normal range of motion. He exhibits no edema.  Neurological: He is alert and oriented to person, place, and time. No cranial nerve deficit. He exhibits normal muscle tone. Coordination normal.  Skin: Skin is warm and dry. He is not diaphoretic. No erythema.  Psychiatric: Affect and judgment normal.     LABORATORY DATA:  I have reviewed the data as listed Lab Results  Component Value Date   WBC 5.7 01/04/2018   HGB 16.5 01/04/2018   HCT 46.6 01/04/2018   MCV 88.5 01/04/2018   PLT 208 01/04/2018   Recent Labs    07/06/17 1325 07/13/17 0833 07/20/17 1103 08/16/17 1103 10/19/17 1113 01/04/18 1326  NA  --  140 138 137  --  139  K  --  4.0 3.9 3.8  --  3.9  CL  --  103 104 103  --  104  CO2  --  22 25 25   --  26  GLUCOSE  --  87 93 94  --  91  BUN  --  11 10 13   --  9  CREATININE  --  0.93 0.77 0.82  --  0.86  CALCIUM  --  9.9 9.8 9.4  --  9.4  GFRNONAA  --  115 >60 >60  --  >60  GFRAA  --  133 >60 >60  --  >60  PROT 8.1 7.5 8.6* 8.2* 8.1 7.9  ALBUMIN 5.0 5.2 5.3* 5.1* 4.9 4.8  AST 19 16 22 24 26 23   ALT 16* 15 18 24 20 25   ALKPHOS 77 86 73 75 77 70  BILITOT 3.6* 4.2* 3.7* 3.2* 3.3* 4.4*  BILIDIR 0.2 0.24 0.2  --  0.2  --   IBILI 3.4* 3.96*   --   --  3.1*  --        ASSESSMENT & PLAN:  1. Vitamin B12 deficiency   2. Gilbert syndrome   3. Indirect hyperbilirubinemia    # B12 deficiency Labs are reviewed and discussed with patient. Continue vitamin B12 parental injection monthly. Check B12, MMA, homocysteine, folate level.  # Gilbert's syndrome: UGT1A1 mutation positive, confirmed to have Gilbert's syndrome. Patient also reports that his uncle has Gilbert's syndrome. Discussed that this is a condition that does not need treatment. Reassurance given.    Orders Placed This Encounter  Procedures  . Vitamin B12    Standing Status:   Standing    Number of Occurrences:   20    Standing Expiration Date:   01/12/2019  . Methylmalonic acid, serum    Standing Status:   Future    Standing Expiration Date:   01/12/2019  . Folate    Standing Status:   Future    Standing Expiration Date:   01/12/2019  . Homocysteine    Standing Status:   Future    Standing Expiration Date:   01/12/2019   All questions were answered. The patient knows to call the clinic with any problems questions or concerns.  Return of visit: 3 months with repeat labs. Total face to face encounter time for this patient visit was 15 min. >50% of the time was  spent in counseling and coordination of care.  Rickard PatienceZhou Lavern Maslow, MD, PhD Hematology Oncology [facility] Pager- [phone] 01/11/2018

## 2018-01-12 ENCOUNTER — Ambulatory Visit: Payer: BC Managed Care – PPO

## 2018-01-12 ENCOUNTER — Ambulatory Visit: Payer: BC Managed Care – PPO | Admitting: Oncology

## 2018-02-08 ENCOUNTER — Inpatient Hospital Stay: Payer: BC Managed Care – PPO | Attending: Oncology

## 2018-02-08 DIAGNOSIS — E538 Deficiency of other specified B group vitamins: Secondary | ICD-10-CM | POA: Diagnosis present

## 2018-02-08 MED ORDER — CYANOCOBALAMIN 1000 MCG/ML IJ SOLN
1000.0000 ug | Freq: Once | INTRAMUSCULAR | Status: AC
  Administered 2018-02-08: 1000 ug via INTRAMUSCULAR

## 2018-02-08 NOTE — Patient Instructions (Signed)
Cyanocobalamin, Vitamin B12 injection What is this medicine? CYANOCOBALAMIN (sye an oh koe BAL a min) is a man made form of vitamin B12. Vitamin B12 is used in the growth of healthy blood cells, nerve cells, and proteins in the body. It also helps with the metabolism of fats and carbohydrates. This medicine is used to treat people who can not absorb vitamin B12. This medicine may be used for other purposes; ask your health care provider or pharmacist if you have questions. COMMON BRAND NAME(S): B-12 Compliance Kit, B-12 Injection Kit, Cyomin, LA-12, Nutri-Twelve, Physicians EZ Use B-12, Primabalt What should I tell my health care provider before I take this medicine? They need to know if you have any of these conditions: -kidney disease -Leber's disease -megaloblastic anemia -an unusual or allergic reaction to cyanocobalamin, cobalt, other medicines, foods, dyes, or preservatives -pregnant or trying to get pregnant -breast-feeding How should I use this medicine? This medicine is injected into a muscle or deeply under the skin. It is usually given by a health care professional in a clinic or doctor's office. However, your doctor may teach you how to inject yourself. Follow all instructions. Talk to your pediatrician regarding the use of this medicine in children. Special care may be needed. Overdosage: If you think you have taken too much of this medicine contact a poison control center or emergency room at once. NOTE: This medicine is only for you. Do not share this medicine with others. What if I miss a dose? If you are given your dose at a clinic or doctor's office, call to reschedule your appointment. If you give your own injections and you miss a dose, take it as soon as you can. If it is almost time for your next dose, take only that dose. Do not take double or extra doses. What may interact with this medicine? -colchicine -heavy alcohol intake This list may not describe all possible  interactions. Give your health care provider a list of all the medicines, herbs, non-prescription drugs, or dietary supplements you use. Also tell them if you smoke, drink alcohol, or use illegal drugs. Some items may interact with your medicine. What should I watch for while using this medicine? Visit your doctor or health care professional regularly. You may need blood work done while you are taking this medicine. You may need to follow a special diet. Talk to your doctor. Limit your alcohol intake and avoid smoking to get the best benefit. What side effects may I notice from receiving this medicine? Side effects that you should report to your doctor or health care professional as soon as possible: -allergic reactions like skin rash, itching or hives, swelling of the face, lips, or tongue -blue tint to skin -chest tightness, pain -difficulty breathing, wheezing -dizziness -red, swollen painful area on the leg Side effects that usually do not require medical attention (report to your doctor or health care professional if they continue or are bothersome): -diarrhea -headache This list may not describe all possible side effects. Call your doctor for medical advice about side effects. You may report side effects to FDA at 1-800-FDA-1088. Where should I keep my medicine? Keep out of the reach of children. Store at room temperature between 15 and 30 degrees C (59 and 85 degrees F). Protect from light. Throw away any unused medicine after the expiration date. NOTE: This sheet is a summary. It may not cover all possible information. If you have questions about this medicine, talk to your doctor, pharmacist, or   health care provider.  2018 Elsevier/Gold Standard (2007-07-30 22:10:20)  

## 2018-03-08 ENCOUNTER — Inpatient Hospital Stay: Payer: BC Managed Care – PPO

## 2018-03-09 ENCOUNTER — Inpatient Hospital Stay: Payer: BC Managed Care – PPO | Attending: Oncology

## 2018-03-09 ENCOUNTER — Inpatient Hospital Stay: Payer: BC Managed Care – PPO

## 2018-03-09 VITALS — BP 103/65 | HR 69 | Temp 96.0°F | Resp 18

## 2018-03-09 DIAGNOSIS — E538 Deficiency of other specified B group vitamins: Secondary | ICD-10-CM

## 2018-03-09 LAB — VITAMIN B12: VITAMIN B 12: 255 pg/mL (ref 180–914)

## 2018-03-09 LAB — FOLATE: FOLATE: 17.5 ng/mL (ref >5.9)

## 2018-03-09 MED ORDER — CYANOCOBALAMIN 1000 MCG/ML IJ SOLN
1000.0000 ug | Freq: Once | INTRAMUSCULAR | Status: AC
  Administered 2018-03-09: 1000 ug via INTRAMUSCULAR

## 2018-03-12 LAB — METHYLMALONIC ACID, SERUM: Methylmalonic Acid, Quantitative: 133 nmol/L (ref 0–378)

## 2018-03-12 LAB — HOMOCYSTEINE: Homocysteine: 10.6 umol/L (ref 0.0–15.0)

## 2018-04-04 ENCOUNTER — Other Ambulatory Visit: Payer: BC Managed Care – PPO

## 2018-04-05 ENCOUNTER — Encounter: Payer: Self-pay | Admitting: Oncology

## 2018-04-05 ENCOUNTER — Inpatient Hospital Stay: Payer: BC Managed Care – PPO

## 2018-04-05 ENCOUNTER — Other Ambulatory Visit: Payer: Self-pay

## 2018-04-05 ENCOUNTER — Inpatient Hospital Stay: Payer: BC Managed Care – PPO | Attending: Oncology | Admitting: Oncology

## 2018-04-05 VITALS — BP 112/74 | HR 67 | Temp 97.1°F | Resp 18 | Wt 192.7 lb

## 2018-04-05 DIAGNOSIS — E538 Deficiency of other specified B group vitamins: Secondary | ICD-10-CM

## 2018-04-05 DIAGNOSIS — Z87891 Personal history of nicotine dependence: Secondary | ICD-10-CM | POA: Diagnosis not present

## 2018-04-05 LAB — VITAMIN B12: VITAMIN B 12: 265 pg/mL (ref 180–914)

## 2018-04-05 MED ORDER — VITAMIN B-12 1000 MCG PO TABS: 1000.0000 ug | ORAL_TABLET | Freq: Every day | ORAL | 2 refills | Status: AC

## 2018-04-05 MED ORDER — CYANOCOBALAMIN 1000 MCG/ML IJ SOLN
1000.0000 ug | Freq: Once | INTRAMUSCULAR | Status: AC
  Administered 2018-04-05: 1000 ug via INTRAMUSCULAR

## 2018-04-05 MED ORDER — CYANOCOBALAMIN 1000 MCG/ML IJ SOLN
INTRAMUSCULAR | Status: AC
  Filled 2018-04-05: qty 1

## 2018-04-05 NOTE — Progress Notes (Signed)
Hematology/Oncology Follow up note [facility] [phone] [phone]) [phone]   Patient Care Team: Dione Housekeeper, MD as PCP - General (Family Medicine)  REFERRING PROVIDER: Corrinne Eagle  REASON FOR VISIT Follow up for treatment of : Vitamin B12 deficiency and hyperbilirubinemia.    HISTORY OF PRESENTING ILLNESS:  Benjamin Green is a  22 y.o.  male with PMH listed below who was referred to me for evaluation of hyperbilirubinemia, possible hemolysis.  Patient had pilonidal cyst excision on 12/21.  Patient was noted to have jaundice.  Liver function tests showed isolated elevated bilirubin of 7.3 with normal AST and ALT.  Coags are normal.  Liver function test was repeated 9 days later which again showed bilirubinemia. Hemoglobin 16, US abdomen showed splenomegaly with no liver abnormality. No older labs to compare.   Patient reports "eyes turn yellow" when dieting and losing weight. He has intentionally lost 30 pounds recently. Denies any herbal medication, recreational drug except marijuana. He was on antibiotics in January 2019. No travel outside of [place], no smoking, social drinking .   INTERVAL HISTORY Benjamin Green is a 22 y.o. male presents for follow-up for vitamin B12 deficiency and hyperbilirubinemia. Has been on parental B12 injections.  Has acne and will start treatment by dermatology.  Denies weight loss, night sweating, fever, neuropathy.   Review of Systems  Constitutional: Negative for chills, diaphoresis, fever, malaise/fatigue and weight loss.  HENT: Negative for congestion, ear discharge, ear pain, hearing loss, nosebleeds, sinus pain and sore throat.   Eyes: Negative for double vision, photophobia, pain, discharge and redness.  Respiratory: Negative for cough, hemoptysis, sputum production, shortness of breath and wheezing.   Cardiovascular: Negative for chest pain, palpitations, orthopnea, claudication and leg swelling.   Gastrointestinal: Negative for abdominal pain, blood in stool, constipation, diarrhea, heartburn, melena, nausea and vomiting.  Genitourinary: Negative for dysuria, flank pain, frequency, hematuria and urgency.  Musculoskeletal: Negative for back pain, myalgias and neck pain.  Skin: Negative for itching and rash.  Neurological: Negative for dizziness, tingling, tremors, sensory change, focal weakness, weakness and headaches.  Endo/Heme/Allergies: Negative for environmental allergies. Does not bruise/bleed easily.  Psychiatric/Behavioral: Negative for depression, hallucinations and suicidal ideas. The patient is not nervous/anxious.     MEDICAL HISTORY:  Past Medical History:  Diagnosis Date  . Complication of anesthesia   . Headache    FREQUENT HA'S  . Pilonidal cyst with abscess   . Vitamin B12 deficiency 07/20/2017    SURGICAL HISTORY: Past Surgical History:  Procedure Laterality Date  . MIDDLE EAR SURGERY    . PILONIDAL CYST EXCISION N/A 04/21/2017   Procedure: CYST EXCISION PILONIDAL EXTENSIVE;  Surgeon: Henrene Dodge, MD;  Location: [facility] ORS;  Service: General;  Laterality: N/A;  . TONSILLECTOMY      SOCIAL HISTORY: Social History   Socioeconomic History  . Marital status: Single    Spouse name: Not on file  . Number of children: Not on file  . Years of education: Not on file  . Highest education level: Not on file  Occupational History  . Not on file  Social Needs  . Financial resource strain: Not on file  . Food insecurity:    Worry: Not on file    Inability: Not on file  . Transportation needs:    Medical: Not on file    Non-medical: Not on file  Tobacco Use  . Smoking status: Former Smoker    Packs/day: 0.50    Years: 6.00  Pack years: 3.00    Types: Cigarettes    Last attempt to quit: 06/14/2016    Years since quitting: 1.8  . Smokeless tobacco: Never Used  Substance and Sexual Activity  . Alcohol use: Yes    Comment: occasional - once a week   .  Drug use: Yes    Types: Marijuana    Comment: MOSTLY 2 TIMES WEEKLY OR MAY SMOKE EVERYDAY IF HE HAS THE MONEY FOR IT  . Sexual activity: Yes  Lifestyle  . Physical activity:    Days per week: Not on file    Minutes per session: Not on file  . Stress: Not on file  Relationships  . Social connections:    Talks on phone: Not on file    Gets together: Not on file    Attends religious service: Not on file    Active member of club or organization: Not on file    Attends meetings of clubs or organizations: Not on file    Relationship status: Not on file  . Intimate partner violence:    Fear of current or ex partner: Not on file    Emotionally abused: Not on file    Physically abused: Not on file    Forced sexual activity: Not on file  Other Topics Concern  . Not on file  Social History Narrative  . Not on file    FAMILY HISTORY: Family History  Problem Relation Age of Onset  . Healthy Mother   . Breast cancer Maternal Aunt     ALLERGIES:  has No Known Allergies.  MEDICATIONS:  Current Outpatient Medications  Medication Sig Dispense Refill  . ibuprofen (ADVIL,MOTRIN) 600 MG tablet Take 1 tablet (600 mg total) by mouth every 8 (eight) hours as needed for headache, mild pain or moderate pain. 30 tablet 0  . Multiple Vitamin (MULTIVITAMIN) tablet Take 1 tablet by mouth daily.    Marland Kitchen RETIN-A MICRO PUMP 0.08 % GEL Apply 1 application topically at bedtime.     . Sulfacetamide Sodium-Sulfur 10-5 % EMUL WASH FACE TWICE DAILY  5  . vitamin B-12 (CYANOCOBALAMIN) 1000 MCG tablet Take 1 tablet (1,000 mcg total) by mouth daily. 30 tablet 2   No current facility-administered medications for this visit.      PHYSICAL EXAMINATION: ECOG PERFORMANCE STATUS: 0 - Asymptomatic Vitals:   04/05/18 1100  BP: 112/74  Pulse: 67  Resp: 18  Temp: (!) 97.1 F (36.2 C)   Filed Weights   04/05/18 1100  Weight: 192 lb 10.9 oz (87.4 kg)    Physical Exam  Constitutional: He is oriented to  person, place, and time and well-developed, well-nourished, and in no distress. No distress.  HENT:  Head: Normocephalic and atraumatic.  Nose: Nose normal.  Mouth/Throat: Oropharynx is clear and moist. No oropharyngeal exudate.  Eyes: Pupils are equal, round, and reactive to light. EOM are normal. Left eye exhibits no discharge. No scleral icterus.  Mild jaudice  Neck: Normal range of motion. Neck supple.  Cardiovascular: Normal rate, regular rhythm and normal heart sounds.  No murmur heard. Pulmonary/Chest: Effort normal. No respiratory distress. He has no rales. He exhibits no tenderness.  Abdominal: Soft. Bowel sounds are normal. He exhibits no distension and no mass. There is no tenderness.  Musculoskeletal: Normal range of motion. He exhibits no edema.  Neurological: He is alert and oriented to person, place, and time. No cranial nerve deficit. He exhibits normal muscle tone. Coordination normal.  Skin: Skin is warm and  dry. He is not diaphoretic. No erythema.  Psychiatric: Affect and judgment normal.     LABORATORY DATA:  I have reviewed the data as listed Lab Results  Component Value Date   WBC 5.7 01/04/2018   HGB 16.5 01/04/2018   HCT 46.6 01/04/2018   MCV 88.5 01/04/2018   PLT 208 01/04/2018   Recent Labs    07/06/17 1325 07/13/17 0833 07/20/17 1103 08/16/17 1103 10/19/17 1113 01/04/18 1326  NA  --  140 138 137  --  139  K  --  4.0 3.9 3.8  --  3.9  CL  --  103 104 103  --  104  CO2  --  22 25 25   --  26  GLUCOSE  --  87 93 94  --  91  BUN  --  11 10 13   --  9  CREATININE  --  0.93 0.77 0.82  --  0.86  CALCIUM  --  9.9 9.8 9.4  --  9.4  GFRNONAA  --  115 >60 >60  --  >60  GFRAA  --  133 >60 >60  --  >60  PROT 8.1 7.5 8.6* 8.2* 8.1 7.9  ALBUMIN 5.0 5.2 5.3* 5.1* 4.9 4.8  AST 19 16 22 24 26 23   ALT 16* 15 18 24 20 25   ALKPHOS 77 86 73 75 77 70  BILITOT 3.6* 4.2* 3.7* 3.2* 3.3* 4.4*  BILIDIR 0.2 0.24 0.2  --  0.2  --   IBILI 3.4* 3.96*  --   --  3.1*  --         ASSESSMENT & PLAN:  1. Vitamin B12 deficiency   2. Gilbert syndrome    # B12 deficiency Has been on monthly B12 injection without significant improvement of B12 level. B12 remains low normal end.  Previous work up showed negative anti parietal antibody and anti intrinsic factor. Normal MMA and homocysteine level.  Suggests patient to starting taking oral vitamin B12 1000mcg daily.  Recommend patient to follow up with primary care physician and have B12 level checked every 6 months.   # Gilbert's syndrome: [id number] mutation positive, confirmed to have Gilbert's syndrome. Patient also reports that his uncle has Gilbert's syndrome. Discussed that this is a condition that does not need treatment. Reassurance given.   All questions were answered. The patient knows to call the clinic with any problems questions or concerns.  Follow up as needed.   Rickard PatienceZhou Dhilan Brauer, MD, PhD Hematology Oncology [facility] Pager- [phone] 04/05/2018

## 2018-04-05 NOTE — Progress Notes (Signed)
Pt here for follow up. No concerns voiced.  

## 2018-09-09 IMAGING — US US ABDOMEN COMPLETE
1 series · 13 of 25 positions shown · non-contrast
Comparison: None.

ADDENDUM:
Spleen is enlarged measuring 13.2 cm in craniocaudad dimension.
Estimated volume 556 cubic cm.

These results will be called to the ordering clinician or
representative by the Radiologist Assistant, and communication
documented in the PACS or zVision Dashboard.
CLINICAL DATA: Atresia bile ducts. Elevated bilirubin. Occasional
back pain
EXAM:
ABDOMEN ULTRASOUND COMPLETE

[Series 1: us abdomen complete · 0.14mm/px · 13 of 100 slices shown]
[im 1/100]
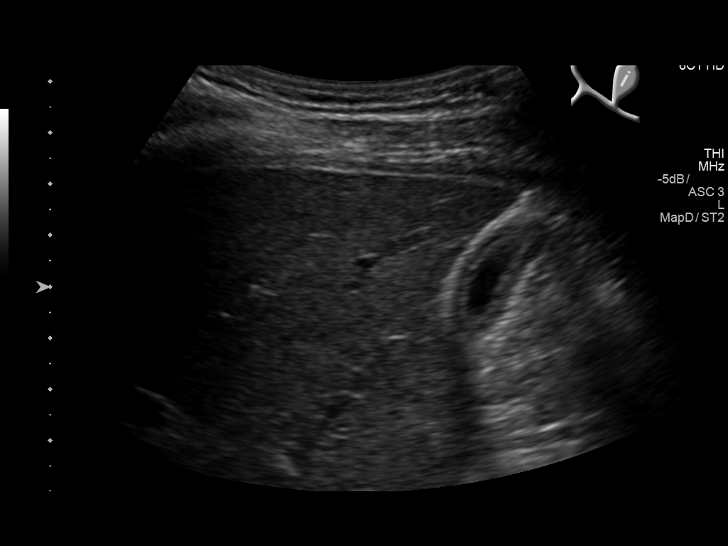
[im 9/100]
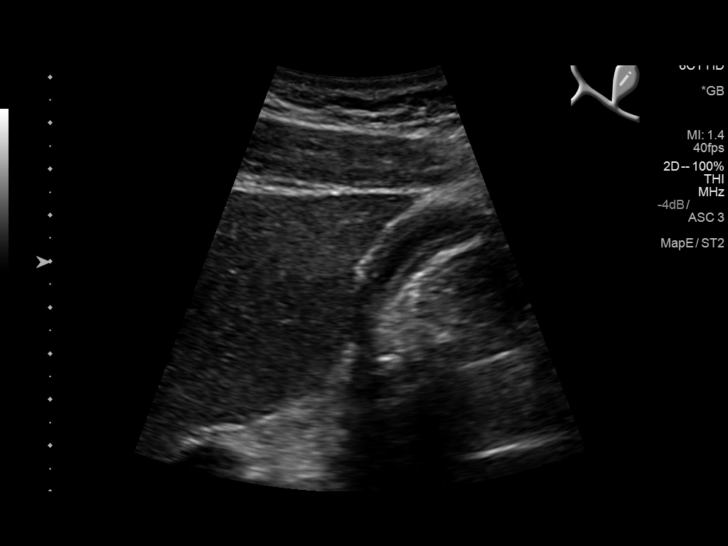
[im 17/100]
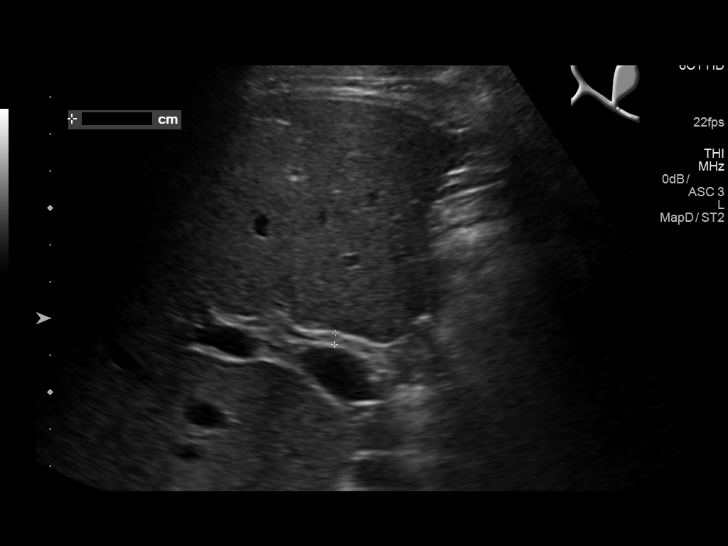
[im 25/100]
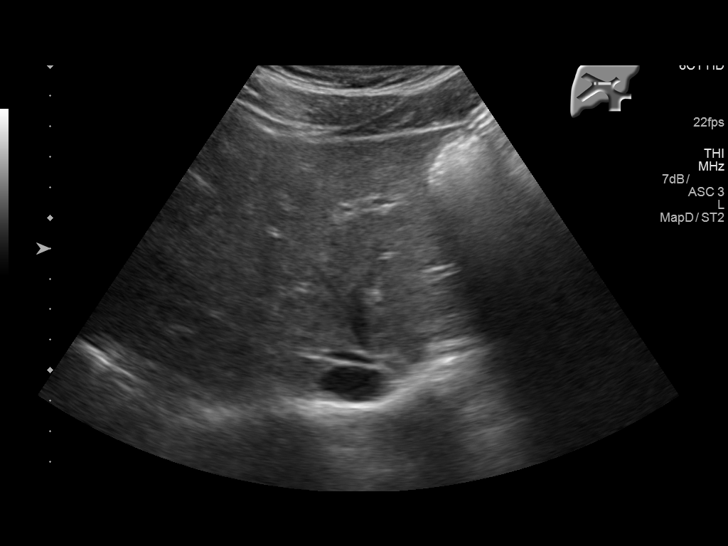
[im 34/100]
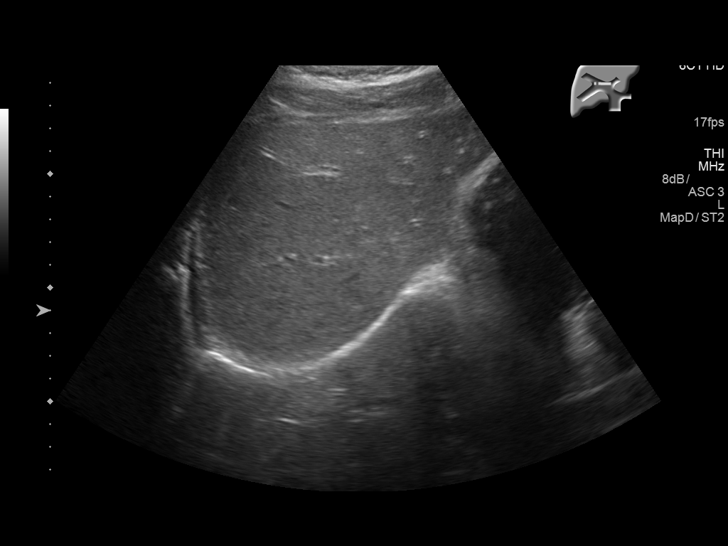
[im 42/100]
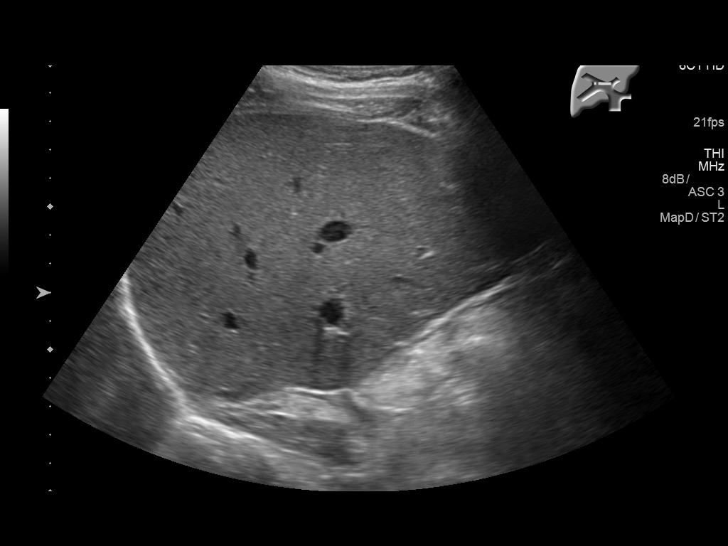
[im 50/100]
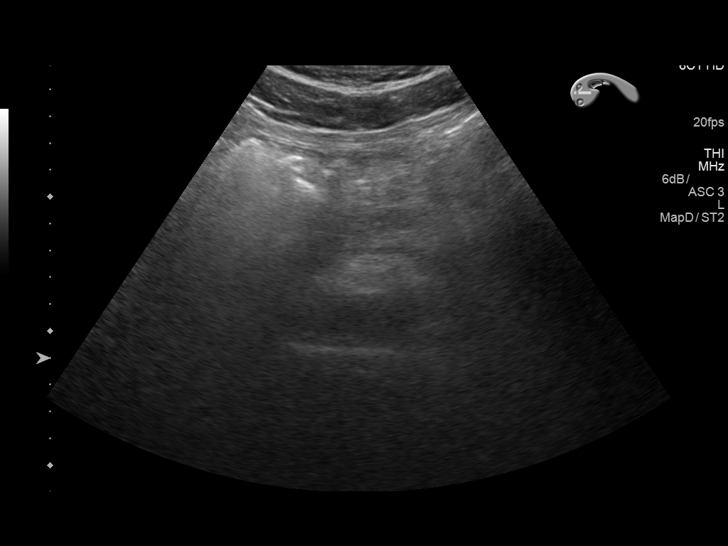
[im 58/100]
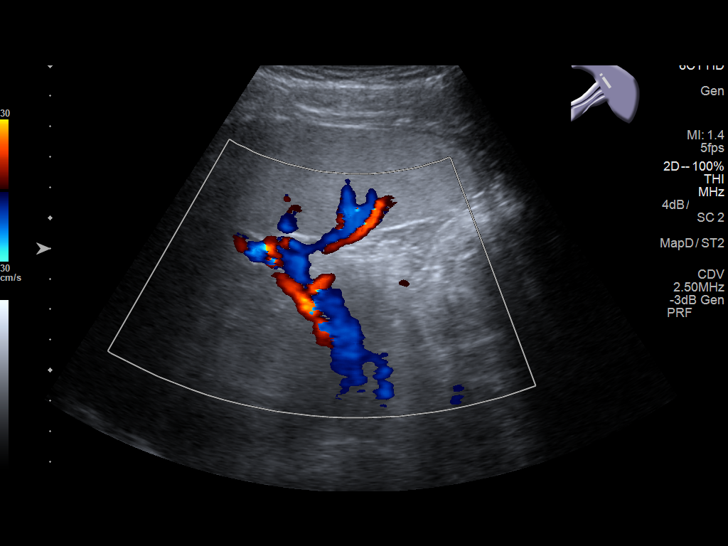
[im 67/100]
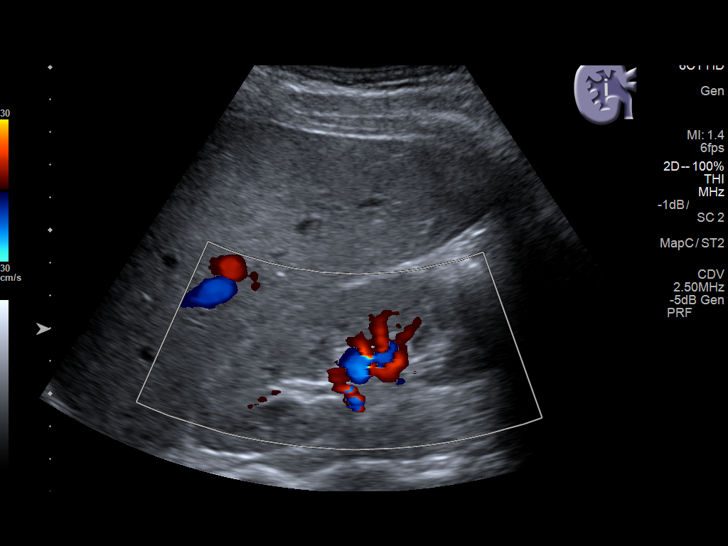
[im 75/100]
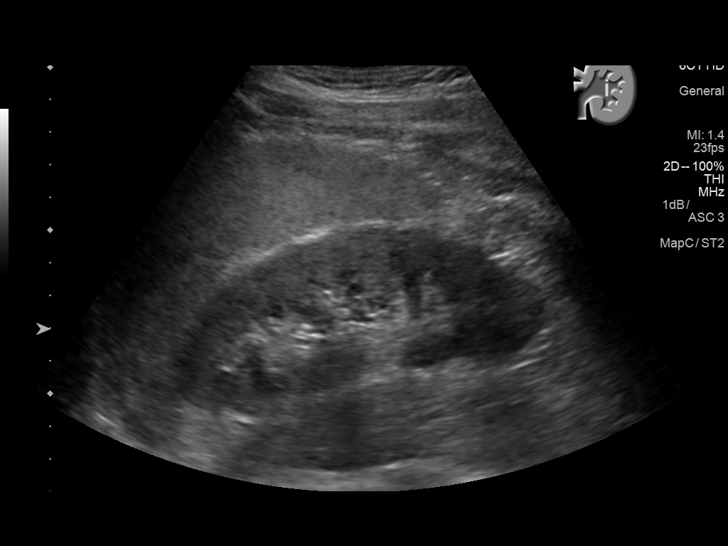
[im 83/100]
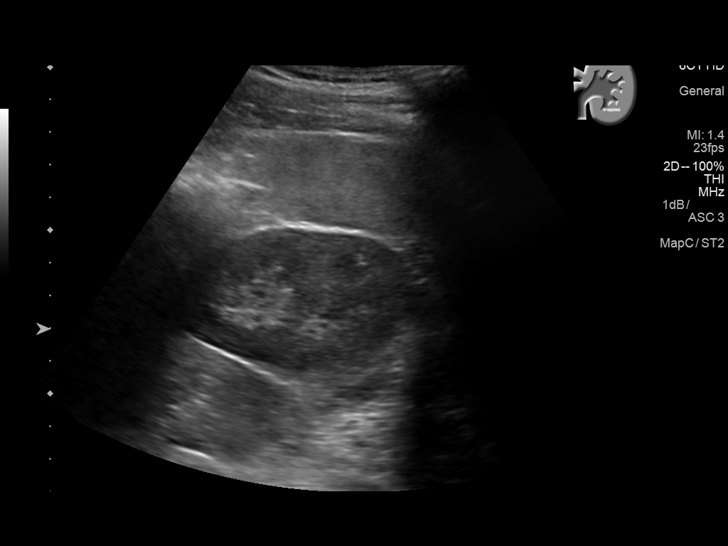
[im 91/100]
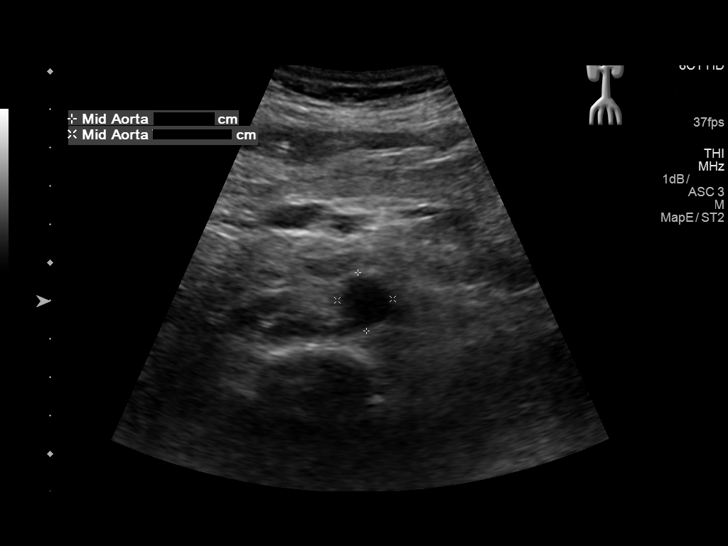
[im 100/100]
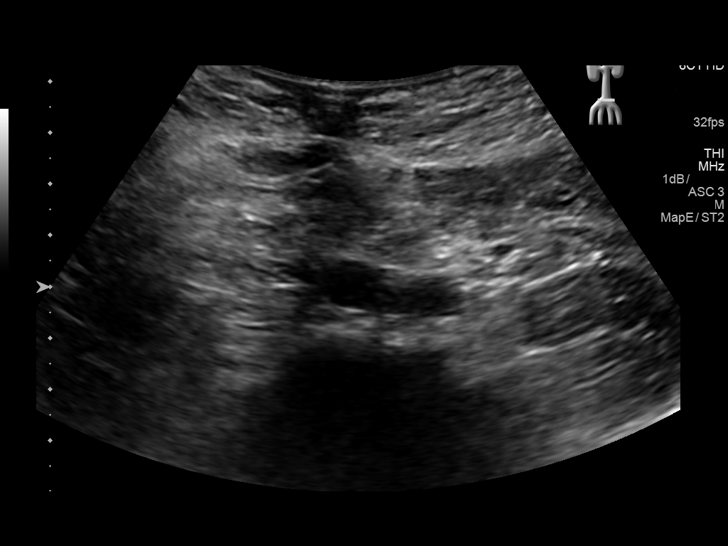

[13 of 25 positions shown; findings below may reference images not displayed]

FINDINGS: Gallbladder: Gallbladder is contracted. No gallstones identified.
Negative sonographic Murphy's sign

Common bile duct: Diameter: Normal at 3 mm

Liver: No focal lesion identified. Within normal limits in
parenchymal echogenicity. Portal vein is patent on color Doppler
imaging with normal direction of blood flow towards the liver.

IVC: No abnormality visualized.

Pancreas: Visualized portion unremarkable.

Spleen: Size and appearance within normal limits.

Right Kidney: Length: 10.8 cm. Echogenicity within normal limits. No
mass or hydronephrosis visualized.

Left Kidney: Length: 11.5 cm. Echogenicity within normal limits. No
mass or hydronephrosis visualized.

Abdominal aorta: No aneurysm visualized.

Other findings: None.
IMPRESSION: 1. No acute abdominal findings by ultrasound.
2. No biliary duct dilatation.  Normal common bile duct.
3. Contracted gallbladder is atypical in NPO patient. No evidence of
acute cholecystitis.

## 2019-03-26 ENCOUNTER — Other Ambulatory Visit: Payer: Self-pay

## 2019-03-26 DIAGNOSIS — Z20822 Contact with and (suspected) exposure to covid-19: Secondary | ICD-10-CM

## 2019-03-28 LAB — NOVEL CORONAVIRUS, NAA: SARS-CoV-2, NAA: NOT DETECTED

## 2022-07-14 ENCOUNTER — Ambulatory Visit
Admission: EM | Admit: 2022-07-14 | Discharge: 2022-07-14 | Disposition: A | Discharge status: Home / Self Care | Payer: BC Managed Care – PPO

## 2022-07-14 ENCOUNTER — Ambulatory Visit (INDEPENDENT_AMBULATORY_CARE_PROVIDER_SITE_OTHER): Payer: BC Managed Care – PPO

## 2022-07-14 ENCOUNTER — Encounter: Payer: Self-pay | Admitting: Oncology

## 2022-07-14 DIAGNOSIS — S93601A Unspecified sprain of right foot, initial encounter: Secondary | ICD-10-CM

## 2022-07-14 NOTE — ED Provider Notes (Signed)
[facility]    CSN: [id number] Arrival date & time: 07/14/22  1915      History   Chief Complaint Chief Complaint  Patient presents with   Foot Pain    RT foot    HPI Benjamin Green is a 27 y.o. male.   HPI  27 year old male here for evaluation of right foot pain.  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  Since then he has been experiencing pain between the balls of his feet as well as through the tops of his second through fifth toe where his toes meet his foot.  There is no bruising, swelling, or redness.  He denies any numbness or tingling.  Past Medical History:  Diagnosis Date   Complication of anesthesia    Headache    FREQUENT HA'S   Pilonidal cyst with abscess    Vitamin B12 deficiency 07/20/2017    Patient Active Problem List   Diagnosis Date Noted   Indirect hyperbilirubinemia 08/17/2017   Vitamin B12 deficiency 07/20/2017   Contact dermatitis 05/18/2017   Pilonidal cyst with abscess 04/11/2017    Past Surgical History:  Procedure Laterality Date   MIDDLE EAR SURGERY     PILONIDAL CYST EXCISION N/A 04/21/2017   Procedure: CYST EXCISION PILONIDAL EXTENSIVE;  Surgeon: Olean Ree, MD;  Location: [facility] ORS;  Service: General;  Laterality: N/A;   TONSILLECTOMY         Home Medications    Prior to Admission medications   Medication Sig Start Date End Date Taking? Authorizing Provider  amphetamine-dextroamphetamine (ADDERALL) 15 MG tablet Take by mouth. 07/13/22 08/12/22 Yes [provider]  buPROPion (WELLBUTRIN XL) 150 MG 24 hr tablet Take by mouth. 07/07/22 09/05/22 Yes [provider]  FLUoxetine (PROZAC) 40 MG capsule Take by mouth. 05/27/22 07/26/22 Yes [provider]  gabapentin (NEURONTIN) 100 MG capsule Take by mouth. 05/27/22 08/25/22 Yes [provider]   lisdexamfetamine (VYVANSE) 40 MG capsule Take by mouth. 06/29/22 07/29/22 Yes [provider]  triamcinolone cream (KENALOG) 0.1 % Apply topically 2 (two) times daily. 05/18/22 05/18/23 Yes [provider]  ibuprofen (ADVIL,MOTRIN) 600 MG tablet Take 1 tablet (600 mg total) by mouth every 8 (eight) hours as needed for headache, mild pain or moderate pain. 04/21/17   Olean Ree, MD  Multiple Vitamin (MULTIVITAMIN) tablet Take 1 tablet by mouth daily.    [provider]  RETIN-A MICRO PUMP 0.08 % GEL Apply 1 application topically at bedtime.  03/27/17   [provider]  Sulfacetamide Sodium-Sulfur 10-5 % EMUL [place] FACE TWICE DAILY 07/04/17   [provider]  vitamin B-12 (CYANOCOBALAMIN) 1000 MCG tablet Take 1 tablet (1,000 mcg total) by mouth daily. 04/05/18   Earlie Server, MD    Family History Family History  Problem Relation Age of Onset   Healthy Mother    Breast cancer Maternal Aunt     Social History Social History   Tobacco Use   Smoking status: Former    Packs/day: 0.50    Years: 6.00    Additional pack years: 0.00    Total pack years: 3.00    Types: Cigarettes    Quit date: 06/14/2016    Years since quitting: 6.0   Smokeless tobacco: Never  Vaping Use   Vaping Use: Every day  Substance Use Topics  Alcohol use: Yes    Comment: occasional - once a week    Drug use: Yes    Types: Marijuana    Comment: MOSTLY 2 TIMES WEEKLY OR MAY SMOKE EVERYDAY IF HE HAS THE MONEY FOR IT     Allergies   Patient has no known allergies.   Review of Systems Review of Systems  Musculoskeletal:  Positive for arthralgias. Negative for joint swelling.  Skin:  Negative for color change.  Neurological:  Negative for weakness and numbness.     Physical Exam Triage Vital Signs ED Triage Vitals  Enc Vitals Group     BP      Pulse      Resp      Temp      Temp src      SpO2      Weight      Height      Head Circumference      Peak Flow       Pain Score      Pain Loc      Pain Edu?      Excl. in [place]?    No data found.  Updated Vital Signs BP 138/81 (BP Location: Right Arm)   Pulse 97   Temp 99.1 F (37.3 C) (Oral)   Ht '5\' 7"'$  (1.702 m)   Wt 220 lb (99.8 kg)   SpO2 95%   BMI 34.46 kg/m   Visual Acuity Right Eye Distance:   Left Eye Distance:   Bilateral Distance:    Right Eye Near:   Left Eye Near:    Bilateral Near:     Physical Exam Vitals and nursing note reviewed.  Musculoskeletal:        General: Tenderness and signs of injury present. No swelling or deformity. Normal range of motion.  Skin:    General: Skin is warm and dry.     Capillary Refill: Capillary refill takes less than 2 seconds.     Findings: No bruising or erythema.      UC Treatments / Results  Labs (all labs ordered are listed, but only abnormal results are displayed) Labs Reviewed - No data to display  EKG   Radiology DG Foot Complete Right  Result Date: 07/14/2022 CLINICAL DATA:  Foot injury EXAM: RIGHT FOOT COMPLETE - 3+ VIEW COMPARISON:  01/12/2007 FINDINGS: There is no evidence of fracture or dislocation. There is no evidence of arthropathy or other focal bone abnormality. Soft tissues are unremarkable. IMPRESSION: Negative. Electronically Signed   By: Donavan Foil M.D.   On: 07/14/2022 19:51    Procedures Procedures (including critical care time)  Medications Ordered in UC Medications - No data to display  Initial Impression / Assessment and Plan / UC Course  I have reviewed the triage vital signs and the nursing notes.  Pertinent labs & imaging results that were available during my care of the patient were reviewed by me and considered in my medical decision making (see chart for details).   Patient is a pleasant, nontoxic-appearing 27 year old male here for evaluation of pain in his right second through fifth toes and between the balls of his feet on the right x 2 days since landing with his toes hyper flexed.  On  exam his toes and his foot are normal anatomical alignment and they are free of ecchymosis, erythema, or edema.  There is no pain with palpation of his toes or with palpation of the MTP joint of the second through fourth  toe or with palpation of the second through fourth metatarsal.  He does have pain with palpation of the soft tissue space between the balls of his feet on the right.  DP and PT pulses are 2+.  I will obtain radiograph to look for any bony abnormality but I suspect the patient has sprained his toes.  Radiology impression of right foot x-ray states no evidence of fracture or dislocation and soft tissue is unremarkable.  Negative exam.  I will discharge patient home with a diagnosis of sprain of his right foot and have him use over-the-counter Tylenol and or ibuprofen according to package instructions needed for pain.  Supportive footwear to help provide cushion and aid in pain relief.  Elevation and topical ice application for 20 minutes at a time 2-3 times a day to help with pain and inflammation.  Work note provided.   Final Clinical Impressions(s) / UC Diagnoses   Final diagnoses:  Sprain of right foot, initial encounter     Discharge Instructions      Your x-rays did not show any broken bones.  I do believe that you have sprained your toes and her foot as result of your injury.  Wear supportive footwear to help protect your foot from further injury as well as give it support while it is healing.  Use over-the-counter Tylenol and/or ibuprofen according to the package instructions as needed for pain.  Keep your right hip elevated is much as possible.  This will help both with pain and also with swelling.  You can apply ice to your foot for 20 minutes at a time 2-3 times a day as needed for pain and swelling.  Return for reevaluation for new or worsening symptoms.     ED Prescriptions   None    PDMP not reviewed this encounter.   Margarette Canada, NP 07/14/22  [phone]

## 2022-07-14 NOTE — Discharge Instructions (Signed)
Your x-rays did not show any broken bones.  I do believe that you have sprained your toes and her foot as result of your injury.  Wear supportive footwear to help protect your foot from further injury as well as give it support while it is healing.  Use over-the-counter Tylenol and/or ibuprofen according to the package instructions as needed for pain.  Keep your right hip elevated is much as possible.  This will help both with pain and also with swelling.  You can apply ice to your foot for 20 minutes at a time 2-3 times a day as needed for pain and swelling.  Return for reevaluation for new or worsening symptoms.

## 2022-07-14 NOTE — ED Triage Notes (Signed)
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

## 2023-05-01 ENCOUNTER — Encounter: Payer: Self-pay | Admitting: Oncology
